# Patient Record
Sex: Female | Born: 1935 | Race: White | Hispanic: No | Smoking: Never smoker
Health system: Southern US, Community
[De-identification: ages and names within clinical notes are randomized; demographics above are authoritative.]

## PROBLEM LIST (undated history)

## (undated) DIAGNOSIS — I10 Essential (primary) hypertension: Secondary | ICD-10-CM

## (undated) DIAGNOSIS — I509 Heart failure, unspecified: Secondary | ICD-10-CM

## (undated) DIAGNOSIS — E785 Hyperlipidemia, unspecified: Secondary | ICD-10-CM

## (undated) DIAGNOSIS — I4891 Unspecified atrial fibrillation: Secondary | ICD-10-CM

## (undated) DIAGNOSIS — I272 Pulmonary hypertension, unspecified: Secondary | ICD-10-CM

## (undated) DIAGNOSIS — K219 Gastro-esophageal reflux disease without esophagitis: Secondary | ICD-10-CM

## (undated) DIAGNOSIS — I639 Cerebral infarction, unspecified: Secondary | ICD-10-CM

## (undated) DIAGNOSIS — F419 Anxiety disorder, unspecified: Secondary | ICD-10-CM

## (undated) DIAGNOSIS — R0602 Shortness of breath: Secondary | ICD-10-CM

## (undated) DIAGNOSIS — Z85038 Personal history of other malignant neoplasm of large intestine: Secondary | ICD-10-CM

## (undated) DIAGNOSIS — M1990 Unspecified osteoarthritis, unspecified site: Secondary | ICD-10-CM

## (undated) DIAGNOSIS — I2510 Atherosclerotic heart disease of native coronary artery without angina pectoris: Secondary | ICD-10-CM

## (undated) DIAGNOSIS — M069 Rheumatoid arthritis, unspecified: Secondary | ICD-10-CM

## (undated) HISTORY — DX: Unspecified osteoarthritis, unspecified site: M19.90

## (undated) HISTORY — DX: Atherosclerotic heart disease of native coronary artery without angina pectoris: I25.10

## (undated) HISTORY — DX: Personal history of other malignant neoplasm of large intestine: Z85.038

## (undated) HISTORY — DX: Anxiety disorder, unspecified: F41.9

## (undated) HISTORY — DX: Gastro-esophageal reflux disease without esophagitis: K21.9

## (undated) HISTORY — PX: APPENDECTOMY: SHX54

## (undated) HISTORY — DX: Cerebral infarction, unspecified: I63.9

## (undated) HISTORY — DX: Pulmonary hypertension, unspecified: I27.20

## (undated) HISTORY — DX: Heart failure, unspecified: I50.9

## (undated) HISTORY — DX: Shortness of breath: R06.02

## (undated) HISTORY — DX: Hyperlipidemia, unspecified: E78.5

## (undated) HISTORY — PX: TONSILLECTOMY: SHX5217

## (undated) HISTORY — DX: Unspecified atrial fibrillation: I48.91

## (undated) HISTORY — DX: Rheumatoid arthritis, unspecified: M06.9

## (undated) HISTORY — PX: CARDIAC CATHETERIZATION: SHX172

## (undated) HISTORY — DX: Essential (primary) hypertension: I10

## (undated) HISTORY — PX: CAROTID STENT: SHX1301

---

## 1987-08-06 HISTORY — PX: PARTIAL COLECTOMY: SHX5273

## 2004-09-05 ENCOUNTER — Ambulatory Visit: Payer: Self-pay | Admitting: Internal Medicine

## 2005-01-02 ENCOUNTER — Ambulatory Visit: Payer: Self-pay | Admitting: Internal Medicine

## 2005-05-10 ENCOUNTER — Ambulatory Visit: Payer: Self-pay | Admitting: Internal Medicine

## 2005-07-09 ENCOUNTER — Encounter: Payer: Self-pay | Admitting: Internal Medicine

## 2005-08-05 HISTORY — PX: CATARACT EXTRACTION: SUR2

## 2006-01-10 ENCOUNTER — Ambulatory Visit: Payer: Self-pay | Admitting: Internal Medicine

## 2006-02-20 ENCOUNTER — Ambulatory Visit: Payer: Self-pay | Admitting: Internal Medicine

## 2006-06-13 ENCOUNTER — Ambulatory Visit: Payer: Self-pay | Admitting: Ophthalmology

## 2006-06-23 ENCOUNTER — Ambulatory Visit: Payer: Self-pay | Admitting: Ophthalmology

## 2006-08-12 ENCOUNTER — Ambulatory Visit: Payer: Self-pay | Admitting: Internal Medicine

## 2007-01-21 DIAGNOSIS — K219 Gastro-esophageal reflux disease without esophagitis: Secondary | ICD-10-CM

## 2007-01-21 DIAGNOSIS — I10 Essential (primary) hypertension: Secondary | ICD-10-CM | POA: Insufficient documentation

## 2007-01-21 DIAGNOSIS — Z85038 Personal history of other malignant neoplasm of large intestine: Secondary | ICD-10-CM | POA: Insufficient documentation

## 2007-01-21 DIAGNOSIS — I2510 Atherosclerotic heart disease of native coronary artery without angina pectoris: Secondary | ICD-10-CM | POA: Insufficient documentation

## 2007-01-21 DIAGNOSIS — E785 Hyperlipidemia, unspecified: Secondary | ICD-10-CM

## 2007-01-23 DIAGNOSIS — M069 Rheumatoid arthritis, unspecified: Secondary | ICD-10-CM | POA: Insufficient documentation

## 2007-02-10 ENCOUNTER — Ambulatory Visit: Payer: Self-pay | Admitting: Internal Medicine

## 2007-02-10 LAB — CONVERTED CEMR LAB
CO2: 32 meq/L (ref 19–32)
Calcium: 9.6 mg/dL (ref 8.4–10.5)
GFR calc Af Amer: 68 mL/min
Glucose, Bld: 96 mg/dL (ref 70–99)
Potassium: 4.8 meq/L (ref 3.5–5.1)
Sodium: 144 meq/L (ref 135–145)

## 2007-02-11 ENCOUNTER — Ambulatory Visit: Payer: Self-pay | Admitting: Internal Medicine

## 2007-02-11 DIAGNOSIS — M159 Polyosteoarthritis, unspecified: Secondary | ICD-10-CM | POA: Insufficient documentation

## 2007-03-03 ENCOUNTER — Ambulatory Visit: Payer: PRIVATE HEALTH INSURANCE | Admitting: Ophthalmology

## 2007-03-03 ENCOUNTER — Other Ambulatory Visit: Payer: Self-pay

## 2007-03-11 ENCOUNTER — Ambulatory Visit: Payer: PRIVATE HEALTH INSURANCE | Admitting: Ophthalmology

## 2007-06-04 ENCOUNTER — Encounter: Payer: Self-pay | Admitting: Internal Medicine

## 2007-06-11 ENCOUNTER — Telehealth (INDEPENDENT_AMBULATORY_CARE_PROVIDER_SITE_OTHER): Payer: Self-pay | Admitting: *Deleted

## 2007-07-09 ENCOUNTER — Ambulatory Visit: Payer: Self-pay | Admitting: Internal Medicine

## 2007-07-21 ENCOUNTER — Ambulatory Visit: Payer: Self-pay | Admitting: Internal Medicine

## 2007-07-21 DIAGNOSIS — G459 Transient cerebral ischemic attack, unspecified: Secondary | ICD-10-CM | POA: Insufficient documentation

## 2007-07-22 ENCOUNTER — Encounter: Payer: Self-pay | Admitting: Internal Medicine

## 2007-07-22 ENCOUNTER — Telehealth: Payer: Self-pay | Admitting: Internal Medicine

## 2007-07-27 ENCOUNTER — Telehealth: Payer: Self-pay | Admitting: Internal Medicine

## 2007-07-27 ENCOUNTER — Encounter: Payer: Self-pay | Admitting: Internal Medicine

## 2007-08-03 ENCOUNTER — Ambulatory Visit: Payer: Self-pay | Admitting: Family Medicine

## 2007-08-03 DIAGNOSIS — I4891 Unspecified atrial fibrillation: Secondary | ICD-10-CM

## 2007-08-03 LAB — CONVERTED CEMR LAB: INR: 2.1

## 2007-08-10 ENCOUNTER — Ambulatory Visit: Payer: Self-pay | Admitting: Internal Medicine

## 2007-08-10 LAB — CONVERTED CEMR LAB: INR: 1.7

## 2007-08-12 ENCOUNTER — Encounter: Payer: Self-pay | Admitting: Internal Medicine

## 2007-08-14 ENCOUNTER — Ambulatory Visit: Payer: Self-pay | Admitting: Internal Medicine

## 2007-08-17 LAB — CONVERTED CEMR LAB
Albumin: 3.9 g/dL (ref 3.5–5.2)
Basophils Absolute: 0 10*3/uL (ref 0.0–0.1)
Cholesterol: 181 mg/dL (ref 0–200)
Eosinophils Absolute: 0.1 10*3/uL (ref 0.0–0.6)
GFR calc Af Amer: 68 mL/min
GFR calc non Af Amer: 56 mL/min
Glucose, Bld: 99 mg/dL (ref 70–99)
HCT: 38.8 % (ref 36.0–46.0)
HDL: 50.7 mg/dL (ref >39.0)
Hemoglobin: 13.6 g/dL (ref 12.0–15.0)
LDL Cholesterol: 105 mg/dL — ABNORMAL HIGH (ref 0–99)
MCHC: 35 g/dL (ref 30.0–36.0)
MCV: 92.5 fL (ref 78.0–100.0)
Monocytes Absolute: 0.4 10*3/uL (ref 0.2–0.7)
Monocytes Relative: 8 % (ref 3.0–11.0)
Neutrophils Relative %: 54.5 % (ref 43.0–77.0)
Potassium: 4.7 meq/L (ref 3.5–5.1)
RBC: 4.19 M/uL (ref 3.87–5.11)
Sodium: 137 meq/L (ref 135–145)
Total CHOL/HDL Ratio: 3.6

## 2007-08-24 ENCOUNTER — Ambulatory Visit: Payer: Self-pay | Admitting: Internal Medicine

## 2007-09-02 ENCOUNTER — Encounter: Payer: Self-pay | Admitting: Internal Medicine

## 2007-09-10 ENCOUNTER — Telehealth (INDEPENDENT_AMBULATORY_CARE_PROVIDER_SITE_OTHER): Payer: Self-pay | Admitting: *Deleted

## 2007-09-23 ENCOUNTER — Ambulatory Visit: Payer: Self-pay | Admitting: Internal Medicine

## 2007-09-23 LAB — CONVERTED CEMR LAB
INR: 3.4
Prothrombin Time: 22.4 s

## 2007-10-20 ENCOUNTER — Telehealth (INDEPENDENT_AMBULATORY_CARE_PROVIDER_SITE_OTHER): Payer: Self-pay | Admitting: *Deleted

## 2007-10-21 ENCOUNTER — Ambulatory Visit: Payer: Self-pay | Admitting: Internal Medicine

## 2007-10-21 LAB — CONVERTED CEMR LAB
INR: 3
Prothrombin Time: 21 s

## 2007-10-28 ENCOUNTER — Encounter: Payer: Self-pay | Admitting: Internal Medicine

## 2007-11-18 ENCOUNTER — Ambulatory Visit: Payer: Self-pay | Admitting: Internal Medicine

## 2007-11-25 ENCOUNTER — Ambulatory Visit: Payer: Self-pay | Admitting: Internal Medicine

## 2007-11-25 ENCOUNTER — Telehealth (INDEPENDENT_AMBULATORY_CARE_PROVIDER_SITE_OTHER): Payer: Self-pay | Admitting: *Deleted

## 2007-12-04 ENCOUNTER — Encounter: Payer: Self-pay | Admitting: Internal Medicine

## 2007-12-05 ENCOUNTER — Encounter: Payer: Self-pay | Admitting: Internal Medicine

## 2007-12-06 ENCOUNTER — Encounter: Payer: Self-pay | Admitting: Internal Medicine

## 2007-12-07 ENCOUNTER — Encounter: Payer: Self-pay | Admitting: Internal Medicine

## 2007-12-25 ENCOUNTER — Ambulatory Visit: Payer: Self-pay | Admitting: Internal Medicine

## 2007-12-25 LAB — CONVERTED CEMR LAB
INR: 2.5
Prothrombin Time: 19.2 s

## 2007-12-30 ENCOUNTER — Encounter: Payer: Self-pay | Admitting: Internal Medicine

## 2008-01-22 ENCOUNTER — Ambulatory Visit: Payer: Self-pay | Admitting: Internal Medicine

## 2008-01-22 LAB — CONVERTED CEMR LAB: INR: 2.6

## 2008-01-25 ENCOUNTER — Ambulatory Visit: Payer: Self-pay | Admitting: Internal Medicine

## 2008-01-25 DIAGNOSIS — R55 Syncope and collapse: Secondary | ICD-10-CM

## 2008-02-19 ENCOUNTER — Ambulatory Visit: Payer: Self-pay | Admitting: Internal Medicine

## 2008-03-02 ENCOUNTER — Encounter: Payer: Self-pay | Admitting: Internal Medicine

## 2008-03-08 ENCOUNTER — Ambulatory Visit: Payer: Self-pay | Admitting: Internal Medicine

## 2008-03-08 LAB — CONVERTED CEMR LAB: Prothrombin Time: 18.2 s

## 2008-03-18 ENCOUNTER — Telehealth: Payer: Self-pay | Admitting: Family Medicine

## 2008-04-05 ENCOUNTER — Ambulatory Visit: Payer: Self-pay | Admitting: Family Medicine

## 2008-04-05 ENCOUNTER — Ambulatory Visit: Payer: Self-pay | Admitting: Internal Medicine

## 2008-04-05 DIAGNOSIS — M25569 Pain in unspecified knee: Secondary | ICD-10-CM | POA: Insufficient documentation

## 2008-04-05 LAB — CONVERTED CEMR LAB: INR: 2.7

## 2008-04-06 ENCOUNTER — Encounter: Payer: Self-pay | Admitting: Internal Medicine

## 2008-04-07 ENCOUNTER — Telehealth (INDEPENDENT_AMBULATORY_CARE_PROVIDER_SITE_OTHER): Payer: Self-pay | Admitting: Internal Medicine

## 2008-04-12 ENCOUNTER — Encounter: Payer: Self-pay | Admitting: Internal Medicine

## 2008-04-28 ENCOUNTER — Ambulatory Visit: Payer: Self-pay | Admitting: Internal Medicine

## 2008-04-29 LAB — CONVERTED CEMR LAB
BUN: 16 mg/dL (ref 6–23)
Calcium: 9.3 mg/dL (ref 8.4–10.5)
Chloride: 106 meq/L (ref 96–112)
Eosinophils Relative: 2.1 % (ref 0.0–5.0)
Glucose, Bld: 87 mg/dL (ref 70–99)
HCT: 39.9 % (ref 36.0–46.0)
Hemoglobin: 13.5 g/dL (ref 12.0–15.0)
Monocytes Absolute: 0.4 10*3/uL (ref 0.1–1.0)
Monocytes Relative: 7.4 % (ref 3.0–12.0)
Platelets: 129 10*3/uL — ABNORMAL LOW (ref 150–400)
Potassium: 4.4 meq/L (ref 3.5–5.1)
RBC: 4.25 M/uL (ref 3.87–5.11)
TSH: 3.38 microintl units/mL (ref 0.35–5.50)
WBC: 4.9 10*3/uL (ref 4.5–10.5)

## 2008-05-05 ENCOUNTER — Encounter: Payer: Self-pay | Admitting: Internal Medicine

## 2008-05-06 ENCOUNTER — Telehealth: Payer: Self-pay | Admitting: Internal Medicine

## 2008-05-25 ENCOUNTER — Encounter: Payer: Self-pay | Admitting: Internal Medicine

## 2008-06-05 HISTORY — PX: TOTAL KNEE ARTHROPLASTY: SHX125

## 2008-06-10 ENCOUNTER — Encounter: Payer: Self-pay | Admitting: Internal Medicine

## 2008-06-10 ENCOUNTER — Encounter: Payer: PRIVATE HEALTH INSURANCE | Admitting: Internal Medicine

## 2008-06-21 ENCOUNTER — Encounter: Payer: Self-pay | Admitting: Internal Medicine

## 2008-06-29 ENCOUNTER — Telehealth (INDEPENDENT_AMBULATORY_CARE_PROVIDER_SITE_OTHER): Payer: Self-pay | Admitting: *Deleted

## 2008-06-29 ENCOUNTER — Encounter: Payer: Self-pay | Admitting: Family Medicine

## 2008-07-04 ENCOUNTER — Telehealth: Payer: Self-pay | Admitting: Internal Medicine

## 2008-07-12 ENCOUNTER — Telehealth: Payer: Self-pay | Admitting: Internal Medicine

## 2008-07-22 ENCOUNTER — Encounter: Payer: Self-pay | Admitting: Internal Medicine

## 2008-08-01 ENCOUNTER — Ambulatory Visit: Payer: Self-pay | Admitting: Internal Medicine

## 2008-08-03 LAB — CONVERTED CEMR LAB
Calcium: 9.3 mg/dL (ref 8.4–10.5)
Eosinophils Relative: 2.3 % (ref 0.0–5.0)
GFR calc non Af Amer: 63 mL/min
HCT: 30.3 % — ABNORMAL LOW (ref 36.0–46.0)
Hemoglobin: 9.9 g/dL — ABNORMAL LOW (ref 12.0–15.0)
Monocytes Relative: 9.6 % (ref 3.0–12.0)
Phosphorus: 3.7 mg/dL (ref 2.3–4.6)
Potassium: 4.5 meq/L (ref 3.5–5.1)
Sodium: 136 meq/L (ref 135–145)
WBC: 4.9 10*3/uL (ref 4.5–10.5)

## 2008-08-29 ENCOUNTER — Ambulatory Visit: Payer: Self-pay | Admitting: Internal Medicine

## 2008-08-29 LAB — CONVERTED CEMR LAB: Prothrombin Time: 17.3 s

## 2008-09-08 ENCOUNTER — Encounter: Payer: Self-pay | Admitting: Internal Medicine

## 2008-09-26 ENCOUNTER — Ambulatory Visit: Payer: Self-pay | Admitting: Internal Medicine

## 2008-10-10 ENCOUNTER — Ambulatory Visit: Payer: Self-pay | Admitting: Internal Medicine

## 2008-10-21 ENCOUNTER — Ambulatory Visit: Payer: Self-pay | Admitting: Internal Medicine

## 2008-10-21 DIAGNOSIS — R079 Chest pain, unspecified: Secondary | ICD-10-CM

## 2008-11-18 ENCOUNTER — Ambulatory Visit: Payer: Self-pay | Admitting: Internal Medicine

## 2008-11-18 LAB — CONVERTED CEMR LAB: Prothrombin Time: 15.6 s

## 2008-12-01 ENCOUNTER — Ambulatory Visit: Payer: Self-pay | Admitting: Internal Medicine

## 2008-12-01 LAB — CONVERTED CEMR LAB
INR: 1.8
Prothrombin Time: 16.4 s

## 2008-12-07 ENCOUNTER — Encounter: Payer: Self-pay | Admitting: Internal Medicine

## 2008-12-15 ENCOUNTER — Ambulatory Visit: Payer: Self-pay | Admitting: Internal Medicine

## 2009-01-12 ENCOUNTER — Ambulatory Visit: Payer: Self-pay | Admitting: Internal Medicine

## 2009-01-12 LAB — CONVERTED CEMR LAB: INR: 2.3

## 2009-02-09 ENCOUNTER — Ambulatory Visit: Payer: Self-pay | Admitting: Internal Medicine

## 2009-03-08 ENCOUNTER — Telehealth: Payer: Self-pay | Admitting: Internal Medicine

## 2009-03-09 ENCOUNTER — Encounter: Payer: Self-pay | Admitting: Internal Medicine

## 2009-03-10 ENCOUNTER — Encounter: Payer: Self-pay | Admitting: Internal Medicine

## 2009-04-05 ENCOUNTER — Ambulatory Visit: Payer: Self-pay | Admitting: Internal Medicine

## 2009-04-05 LAB — CONVERTED CEMR LAB
INR: 2.8
Prothrombin Time: 20.2 s

## 2009-04-18 ENCOUNTER — Ambulatory Visit: Payer: Self-pay | Admitting: Internal Medicine

## 2009-04-18 LAB — CONVERTED CEMR LAB: INR: 2.5

## 2009-05-16 ENCOUNTER — Encounter (INDEPENDENT_AMBULATORY_CARE_PROVIDER_SITE_OTHER): Payer: Self-pay | Admitting: Internal Medicine

## 2009-05-16 ENCOUNTER — Ambulatory Visit: Payer: Self-pay | Admitting: Family Medicine

## 2009-05-16 ENCOUNTER — Ambulatory Visit: Payer: Self-pay | Admitting: Internal Medicine

## 2009-05-16 LAB — CONVERTED CEMR LAB: Prothrombin Time: 21.4 s

## 2009-05-17 ENCOUNTER — Encounter: Payer: Self-pay | Admitting: Internal Medicine

## 2009-06-13 ENCOUNTER — Ambulatory Visit: Payer: Self-pay | Admitting: Internal Medicine

## 2009-06-14 ENCOUNTER — Telehealth (INDEPENDENT_AMBULATORY_CARE_PROVIDER_SITE_OTHER): Payer: Self-pay | Admitting: Internal Medicine

## 2009-06-20 ENCOUNTER — Encounter: Payer: Self-pay | Admitting: Internal Medicine

## 2009-06-22 ENCOUNTER — Encounter: Payer: Self-pay | Admitting: Internal Medicine

## 2009-07-11 ENCOUNTER — Ambulatory Visit: Payer: Self-pay | Admitting: Internal Medicine

## 2009-07-11 LAB — CONVERTED CEMR LAB: Prothrombin Time: 19 s

## 2009-07-27 ENCOUNTER — Telehealth (INDEPENDENT_AMBULATORY_CARE_PROVIDER_SITE_OTHER): Payer: Self-pay | Admitting: Internal Medicine

## 2009-08-07 ENCOUNTER — Telehealth: Payer: Self-pay | Admitting: Internal Medicine

## 2009-08-08 ENCOUNTER — Ambulatory Visit: Payer: Self-pay | Admitting: Internal Medicine

## 2009-08-08 LAB — CONVERTED CEMR LAB
INR: 2.6
Prothrombin Time: 19.5 s

## 2009-09-07 ENCOUNTER — Telehealth: Payer: Self-pay | Admitting: Internal Medicine

## 2009-09-15 ENCOUNTER — Ambulatory Visit: Payer: Self-pay | Admitting: Internal Medicine

## 2009-09-15 LAB — CONVERTED CEMR LAB: Prothrombin Time: 16.5 s

## 2009-10-12 ENCOUNTER — Ambulatory Visit: Payer: Self-pay | Admitting: Internal Medicine

## 2009-11-09 ENCOUNTER — Ambulatory Visit: Payer: Self-pay | Admitting: Internal Medicine

## 2009-11-27 ENCOUNTER — Telehealth: Payer: Self-pay | Admitting: Internal Medicine

## 2009-12-07 ENCOUNTER — Ambulatory Visit: Payer: Self-pay | Admitting: Internal Medicine

## 2009-12-07 LAB — CONVERTED CEMR LAB: INR: 2.5

## 2009-12-27 ENCOUNTER — Ambulatory Visit: Payer: Self-pay | Admitting: Internal Medicine

## 2009-12-29 LAB — CONVERTED CEMR LAB
ALT: 13 units/L (ref 0–35)
AST: 16 units/L (ref 0–37)
Alkaline Phosphatase: 67 units/L (ref 39–117)
BUN: 19 mg/dL (ref 6–23)
Basophils Relative: 0.6 % (ref 0.0–3.0)
CO2: 32 meq/L (ref 19–32)
Chloride: 101 meq/L (ref 96–112)
Eosinophils Relative: 2.5 % (ref 0.0–5.0)
GFR calc non Af Amer: 67.4 mL/min (ref >60)
HCT: 32.6 % — ABNORMAL LOW (ref 36.0–46.0)
Hemoglobin: 10.8 g/dL — ABNORMAL LOW (ref 12.0–15.0)
Lymphocytes Relative: 35.2 % (ref 12.0–46.0)
Lymphs Abs: 1.6 10*3/uL (ref 0.7–4.0)
Monocytes Relative: 10.4 % (ref 3.0–12.0)
Neutro Abs: 2.4 10*3/uL (ref 1.4–7.7)
Potassium: 4 meq/L (ref 3.5–5.1)
RBC: 4.11 M/uL (ref 3.87–5.11)
Sodium: 142 meq/L (ref 135–145)
TSH: 3.57 microintl units/mL (ref 0.35–5.50)
WBC: 4.7 10*3/uL (ref 4.5–10.5)

## 2010-01-08 ENCOUNTER — Telehealth: Payer: Self-pay | Admitting: Internal Medicine

## 2010-01-16 ENCOUNTER — Encounter: Payer: Self-pay | Admitting: Internal Medicine

## 2010-02-13 ENCOUNTER — Ambulatory Visit: Payer: Self-pay | Admitting: Internal Medicine

## 2010-02-13 LAB — CONVERTED CEMR LAB: Prothrombin Time: 30.6 s

## 2010-02-28 ENCOUNTER — Telehealth: Payer: Self-pay | Admitting: Internal Medicine

## 2010-03-13 ENCOUNTER — Ambulatory Visit: Payer: Self-pay | Admitting: Internal Medicine

## 2010-04-10 ENCOUNTER — Ambulatory Visit: Payer: Self-pay | Admitting: Internal Medicine

## 2010-04-11 LAB — CONVERTED CEMR LAB
Albumin: 4.1 g/dL (ref 3.5–5.2)
Calcium: 9.3 mg/dL (ref 8.4–10.5)
Chloride: 100 meq/L (ref 96–112)
Glucose, Bld: 84 mg/dL (ref 70–99)
Phosphorus: 3.6 mg/dL (ref 2.3–4.6)
Potassium: 4.4 meq/L (ref 3.5–5.1)
Sodium: 140 meq/L (ref 135–145)

## 2010-05-08 ENCOUNTER — Ambulatory Visit: Payer: Self-pay | Admitting: Internal Medicine

## 2010-05-08 LAB — CONVERTED CEMR LAB
INR: 2.2
Prothrombin Time: 26.4 s

## 2010-06-05 ENCOUNTER — Ambulatory Visit: Payer: Self-pay | Admitting: Internal Medicine

## 2010-06-05 HISTORY — PX: ABLATION OF DYSRHYTHMIC FOCUS: SHX254

## 2010-06-05 LAB — CONVERTED CEMR LAB: Prothrombin Time: 38 s

## 2010-06-08 ENCOUNTER — Telehealth: Payer: Self-pay | Admitting: Internal Medicine

## 2010-06-11 ENCOUNTER — Encounter: Payer: Self-pay | Admitting: Internal Medicine

## 2010-06-13 ENCOUNTER — Encounter: Payer: Self-pay | Admitting: Internal Medicine

## 2010-06-19 ENCOUNTER — Ambulatory Visit: Payer: Self-pay | Admitting: Internal Medicine

## 2010-06-19 ENCOUNTER — Telehealth (INDEPENDENT_AMBULATORY_CARE_PROVIDER_SITE_OTHER): Payer: Self-pay | Admitting: *Deleted

## 2010-06-19 LAB — CONVERTED CEMR LAB
INR: 3
Prothrombin Time: 27.3 s

## 2010-06-21 LAB — CONVERTED CEMR LAB
AST: 16 units/L (ref 0–37)
Albumin: 3.9 g/dL (ref 3.5–5.2)
BUN: 21 mg/dL (ref 6–23)
Basophils Absolute: 0 10*3/uL (ref 0.0–0.1)
CO2: 35 meq/L — ABNORMAL HIGH (ref 19–32)
Chloride: 94 meq/L — ABNORMAL LOW (ref 96–112)
Creatinine, Ser: 1.1 mg/dL (ref 0.4–1.2)
Eosinophils Absolute: 0.1 10*3/uL (ref 0.0–0.7)
Free T4: 1.03 ng/dL (ref 0.60–1.60)
GFR calc non Af Amer: 48.97 mL/min (ref >60)
HCT: 31.6 % — ABNORMAL LOW (ref 36.0–46.0)
Hemoglobin: 10.2 g/dL — ABNORMAL LOW (ref 12.0–15.0)
Lymphs Abs: 1.5 10*3/uL (ref 0.7–4.0)
MCHC: 32.3 g/dL (ref 30.0–36.0)
Monocytes Absolute: 0.6 10*3/uL (ref 0.1–1.0)
Neutro Abs: 3.2 10*3/uL (ref 1.4–7.7)
Platelets: 157 10*3/uL (ref 150.0–400.0)
RDW: 17.9 % — ABNORMAL HIGH (ref 11.5–14.6)
TSH: 5.77 microintl units/mL — ABNORMAL HIGH (ref 0.35–5.50)
Total Bilirubin: 0.7 mg/dL (ref 0.3–1.2)

## 2010-06-27 ENCOUNTER — Encounter: Payer: Self-pay | Admitting: Internal Medicine

## 2010-07-03 ENCOUNTER — Ambulatory Visit: Payer: Self-pay | Admitting: Internal Medicine

## 2010-07-03 LAB — CONVERTED CEMR LAB: INR: 2.4

## 2010-07-06 ENCOUNTER — Encounter: Payer: Self-pay | Admitting: Internal Medicine

## 2010-07-18 ENCOUNTER — Ambulatory Visit: Payer: Self-pay | Admitting: Internal Medicine

## 2010-07-19 LAB — CONVERTED CEMR LAB
INR: 2.1
Prothrombin Time: 25.3 s

## 2010-08-13 ENCOUNTER — Ambulatory Visit
Admission: RE | Admit: 2010-08-13 | Discharge: 2010-08-13 | Payer: Self-pay | Source: Home / Self Care | Attending: Internal Medicine | Admitting: Internal Medicine

## 2010-08-13 LAB — CONVERTED CEMR LAB: Prothrombin Time: 27.4 s

## 2010-09-02 LAB — CONVERTED CEMR LAB: INR: 2.3

## 2010-09-04 NOTE — Letter (Signed)
Summary: [facility]   Imported By: Lanelle Bal 06/27/2010 09:30:23  _____________________________________________________________________  External Attachment:    Type:   Image     Comment:   External Document

## 2010-09-04 NOTE — Assessment & Plan Note (Signed)
Summary: FOLLOW UP / LFW   Vital Signs:  Patient profile:   77 year old female Weight:      207 pounds O2 Sat:      97 % on Room air Temp:     97.4 degrees F oral Pulse rate:   54 / minute Pulse rhythm:   regular BP sitting:   126 / 68  (left arm) Cuff size:   large  Vitals Entered By: Mervin Hack CMA (AAMA) (September 15, 2009 11:13 AM)  O2 Flow:  Room air CC: 72month follow-up   History of Present Illness: Doing pretty well Notes general fatigue Limited exercise tolerance--legs "give out" just walking in her house Does note some SOB but not prominent  Rides cycle thing in house discussed light weights for muscle toning  No sig chest pain of late still occ uses NTG Has hollow sensation in upper chest Notes some numb feeling in chest--wonders about arthritis  does have pain chronically in neck and hands Tramadol helps these pains helps the chest sensation also  No sig edema of late--does take diuretic Sleeps with 2 pillows  Rare PND--relates to house being too warm  Allergies: 1)  ! Motrin 2)  ! Zocor 3)  ! Crestor 4)  ! Zetia  Past History:  Past medical, surgical, family and social histories (including risk factors) reviewed for relevance to current acute and chronic problems.  Past Medical History: Colon cancer, hx of Coronary artery disease------------------------------------Dr Pulikkotil GERD Hyperlipidemia Hypertension Rheumatoid arthritis Osteoarthritis Atrial fibrillation TIA--12/08 Pulmonary hypertension CHF  Past Surgical History: Reviewed history from 06/26/2009 and no changes required. Appendectomy Tonsillectomy Stent 2000 Partial colectomy colon 1989 VD x1 Cath severe MR good LV EF, mild CAD 05/2005 Cataract Ll  2007 5/09 Chest pain--cath at [facility] shows patent stent and no sig obstructive disease Right TKR 11/09 Admitted with SOB 11/10-------------severe pulm HTN found on echo  Family History: Reviewed history from  01/21/2007 and no changes required. Father: deceased 22  MI Mother: deceased 87 MI 5 brothers--2 died, 1 has lung cancer 4 sisters CAD strong in family HTN strong Son has DM Colon cancer in 2 pat aunts  Social History: Reviewed history from 01/21/2007 and no changes required. Widowed 1988 Children: 1 son Retired--textiles  Never Smoked Alcohol use-no  Review of Systems       weight is up  4+# Doesn't eat that much No heartburn problems  Physical Exam  General:  alert.  NAD Neck:  supple, no masses, no thyromegaly, no carotid bruits, and no cervical lymphadenopathy.   Lungs:  normal respiratory effort and normal breath sounds.   Heart:  normal rate, regular rhythm, no murmur, and no gallop.   Extremities:  no edema Psych:  normally interactive, good eye contact, not anxious appearing, and not depressed appearing.     Impression & Recommendations:  Problem # 1:  CHRONIC SYSTOLIC HEART FAILURE (ICD-428.22) Assessment Unchanged no change in NYHA Class 3  symptoms could have element of decondtioning discussed graded exercise neutral fluid status  Her updated medication list for this problem includes:    Adult Aspirin Ec Low Strength 81 Mg Tbec (Aspirin) .Marland Kitchen... Take 1 tablet by mouth once a day    Coumadin 5 Mg Tabs (Warfarin sodium) .Marland Kitchen... Take 1 tablet by mouth once a day    Metoprolol Tartrate 100 Mg Tabs (Metoprolol tartrate) .Marland Kitchen... Take 1 in am and 1 & 1/2 in evening    Coumadin 1 Mg Tabs (Warfarin sodium) .Marland Kitchen... Take  1 tablet three times a day or as directed    Furosemide 20 Mg Tabs (Furosemide) .Marland Kitchen... Take 1 by mouth once daily  Problem # 2:  ATRIAL FIBRILLATION, PAROXYSMAL (ICD-427.31) Assessment: Comment Only regular today on coumadin  Her updated medication list for this problem includes:    Adult Aspirin Ec Low Strength 81 Mg Tbec (Aspirin) .Marland Kitchen... Take 1 tablet by mouth once a day    Coumadin 5 Mg Tabs (Warfarin sodium) .Marland Kitchen... Take 1 tablet by mouth once a day     Metoprolol Tartrate 100 Mg Tabs (Metoprolol tartrate) .Marland Kitchen... Take 1 in am and 1 & 1/2 in evening    Coumadin 1 Mg Tabs (Warfarin sodium) .Marland Kitchen... Take 1 tablet three times a day or as directed    Norvasc 10 Mg Tabs (Amlodipine besylate) .Marland Kitchen... Take 1/2 tablet by mouth once daily  Problem # 3:  HYPERLIPIDEMIA (ICD-272.4) Assessment: Unchanged reasonable without meds  Labs Reviewed: SGPT: 17 (08/14/2007)   HDL:50.7 (08/14/2007)  LDL:105 (08/14/2007)  Chol:181 (08/14/2007)  Trig:128 (08/14/2007)  Problem # 4:  HYPERTENSION (ICD-401.9) Assessment: Unchanged good control  Her updated medication list for this problem includes:    Metoprolol Tartrate 100 Mg Tabs (Metoprolol tartrate) .Marland Kitchen... Take 1 in am and 1 & 1/2 in evening    Norvasc 10 Mg Tabs (Amlodipine besylate) .Marland Kitchen... Take 1/2 tablet by mouth once daily    Furosemide 20 Mg Tabs (Furosemide) .Marland Kitchen... Take 1 by mouth once daily  BP today: 126/68 Prior BP: 116/70 (05/16/2009)  Labs Reviewed: K+: 4.5 (08/01/2008) Creat: : 0.9 (08/01/2008)   Chol: 181 (08/14/2007)   HDL: 50.7 (08/14/2007)   LDL: 105 (08/14/2007)   TG: 128 (08/14/2007)  Problem # 5:  OSTEOARTHRITIS (ICD-715.90) Assessment: Unchanged does okay with med chest may be from some mild costochondritis  Her updated medication list for this problem includes:    Adult Aspirin Ec Low Strength 81 Mg Tbec (Aspirin) .Marland Kitchen... Take 1 tablet by mouth once a day    Tramadol Hcl 50 Mg Tabs (Tramadol hcl) .Marland Kitchen... Take 1 by mouth every 6 hours as needed pain  Complete Medication List: 1)  Adult Aspirin Ec Low Strength 81 Mg Tbec (Aspirin) .... Take 1 tablet by mouth once a day 2)  Prilosec Otc 20 Mg Tbec (Omeprazole magnesium) .... Take 1 tablet by mouth once a day 3)  Coumadin 5 Mg Tabs (Warfarin sodium) .... Take 1 tablet by mouth once a day 4)  Metoprolol Tartrate 100 Mg Tabs (Metoprolol tartrate) .... Take 1 in am and 1 & 1/2 in evening 5)  Potassium Chloride Cr 10 Meq Cpcr (Potassium  chloride) .... Take one by mouth daily 6)  Nitrolingual 0.4 Mg/spray Tl Soln (Nitroglycerin) .Marland Kitchen.. 1 spray under tongue as needed for angina 7)  Coumadin 1 Mg Tabs (Warfarin sodium) .... Take 1 tablet three times a day or as directed 8)  Tramadol Hcl 50 Mg Tabs (Tramadol hcl) .... Take 1 by mouth every 6 hours as needed pain 9)  Norvasc 10 Mg Tabs (Amlodipine besylate) .... Take 1/2 tablet by mouth once daily 10)  Fish Oil 1000 Mg Caps (Omega-3 fatty acids) .... Take 2 by mouth once daily 11)  Imdur 30 Mg Xr24h-tab (Isosorbide mononitrate) .... Take 1 by mouth once daily 12)  Furosemide 20 Mg Tabs (Furosemide) .... Take 1 by mouth once daily  Patient Instructions: 1)  Please schedule a follow-up appointment in 4 months .   Current Allergies (reviewed today): ! MOTRIN ! ZOCOR ! CRESTOR !  ZETIA

## 2010-09-04 NOTE — Letter (Signed)
Summary: [facility]  [facility],Note   Imported By: Beau Fanny 01/23/2010 10:25:49  _____________________________________________________________________  External Attachment:    Type:   Image     Comment:   External Document

## 2010-09-04 NOTE — Letter (Signed)
Summary: [facility]  [facility]   Imported By: Lanelle Bal 08/18/2009 13:46:43  _____________________________________________________________________  External Attachment:    Type:   Image     Comment:   External Document  Appended Document: [facility] CHF better since hospitalization

## 2010-09-04 NOTE — Progress Notes (Signed)
  Phone Note Call from Patient Call back at [phone]   Caller: [facility] Call For: [facility] Summary of Call: Pt. had appt. w/ Dr.Letvak today.  Pt's son called to cancel appt..  He had to call an ambulance last night to take pt. to [facility] and they admitted her. Initial call taken by: Beau Fanny,  June 08, 2010 8:16 AM  Follow-up for Phone Call        son called me at home I spoke to him and then her in the hospital at [facility] 11/5 had MI (?by enzymes) and ?cardioversion due for cath  [phone]  Spoke to her today had cath today---not sure if any sig changes Keeping her there for the next couple of days while they get her coumadin therapuetic again Cindee Salt MD  June 11, 2010 5:15 PM   message left for son she wasn't in room now---?discharged? Richard Dia Crawford MD  June 14, 2010 1:01 PM   Additional Follow-up for Phone Call Additional follow up Details #1::        spoke to son home for a couple of days due for protime on Tuesday--will set up appt with me also for hospital follow up Additional Follow-up by: Cindee Salt MD,  June 15, 2010 1:51 PM

## 2010-09-04 NOTE — Progress Notes (Signed)
----   Converted from flag ---- ---- 06/19/2010 3:47 PM, Melody Comas wrote: Called patient to reschedule appt. Patient asked that I call Ree Kida to reschedule appt. at [phone]. LMOM for Ree Kida to return my call to reschedule appt.    ---- 06/19/2010 1:35 PM, Cindee Salt MD wrote: thank you!!  ---- 06/19/2010 1:34 PM, Melody Comas wrote: Molli Knock, I will call her and reschedule that for two weeks. Thanks.   ---- 06/19/2010 1:29 PM, Cindee Salt MD wrote: Morrie Sheldon,  She should be checked again in 2 weeks---she was just started on amiodarone so we have to watch her closer  Rich ------------------------------  Appended Document:  .   Appended Document:  Appt. rescheduled.

## 2010-09-04 NOTE — Assessment & Plan Note (Signed)
Summary: 4 MONTH FOLLOW UP/[facility]   Vital Signs:  Patient profile:   77 year old female Weight:      203 pounds Temp:     98.1 degrees F oral Pulse rate:   56 / minute Pulse rhythm:   regular BP sitting:   140 / 68  (left arm) Cuff size:   regular  Vitals Entered By: Mervin Hack CMA Duncan Dull) (April 10, 2010 11:50 AM) CC: 4 month follow-up   History of Present Illness: DOing okay "doing about as good as expected"  Able to "do what I have to" Can take a bath with seat Has friend and daughter to help with house work cooks a little and son brings her food  Has had occ angina NTG occ--and it helps Has noticed some palpitations--heart will go fast at times SOme dyspnea with this usually better with some rest  Conitnues on the coumadin wondered about pradaxa--discussed my reluctance  does take her fluid pills in evening seems to work better for her then puts up with the nocturia  Ongoing arthritis pain Mostly in low back tramadol does help but tries to limit--takes 1-2 per day but may need more  has some stomach symptoms wonders about her past surgery heartburn generally controlled with prilosec   Allergies: 1)  ! Motrin 2)  ! Zocor 3)  ! Crestor 4)  ! Zetia  Past History:  Past medical, surgical, family and social histories (including risk factors) reviewed for relevance to current acute and chronic problems.  Past Medical History: Reviewed history from 09/15/2009 and no changes required. Colon cancer, hx of Coronary artery disease------------------------------------Dr Pulikkotil GERD Hyperlipidemia Hypertension Rheumatoid arthritis Osteoarthritis Atrial fibrillation TIA--12/08 Pulmonary hypertension CHF  Past Surgical History: Reviewed history from 06/26/2009 and no changes required. Appendectomy Tonsillectomy Stent 2000 Partial colectomy colon 1989 VD x1 Cath severe MR good LV EF, mild CAD 05/2005 Cataract Ll  2007 5/09 Chest pain--cath  at [facility] shows patent stent and no sig obstructive disease Right TKR 11/09 Admitted with SOB 11/10-------------severe pulm HTN found on echo  Family History: Reviewed history from 01/21/2007 and no changes required. Father: deceased 79  MI Mother: deceased 81 MI 5 brothers--2 died, 1 has lung cancer 4 sisters CAD strong in family HTN strong Son has DM Colon cancer in 2 pat aunts  Social History: Reviewed history from 01/21/2007 and no changes required. Widowed 1988 Children: 1 son Retired--textiles  Never Smoked Alcohol use-no  Review of Systems       Occ numbness in arms or legs no bruising weight is stable sleeps okay constipation at times---uses miralax and occ dulcolax  Physical Exam  General:  alert and normal appearance.   Neck:  supple, no masses, no thyromegaly, no carotid bruits, and no cervical lymphadenopathy.   Lungs:  normal respiratory effort, no intercostal retractions, no accessory muscle use, and normal breath sounds.   Heart:  normal rate, regular rhythm, no murmur, and no gallop.   Abdomen:  soft and non-tender.   Extremities:  no edema Psych:  normally interactive, good eye contact, not anxious appearing, and not depressed appearing.     Impression & Recommendations:  Problem # 1:  CHRONIC SYSTOLIC HEART FAILURE (ICD-428.22) Assessment Unchanged  compensated neutral fluid status no changes needed  Her updated medication list for this problem includes:    Coumadin 5 Mg Tabs (Warfarin sodium) .Marland Kitchen... Take 1 tablet by mouth once a day    Metoprolol Tartrate 100 Mg Tabs (Metoprolol tartrate) .Marland Kitchen... Take  1 in am and 1 & 1/2 in evening    Coumadin 1 Mg Tabs (Warfarin sodium) .Marland Kitchen... Take 1 tablet three times a day or as directed    Furosemide 20 Mg Tabs (Furosemide) .Marland Kitchen... Take 1 by mouth once daily    Adult Aspirin Ec Low Strength 81 Mg Tbec (Aspirin) .Marland Kitchen... Take 1 tablet by mouth once a day  Orders: TLB-Renal Function Panel (80069-RENAL) Venipuncture  ([id number])  Problem # 2:  ATRIAL FIBRILLATION, PAROXYSMAL (ICD-427.31) Assessment: Unchanged regular now but still gets paroxysms on coumadin  Her updated medication list for this problem includes:    Coumadin 5 Mg Tabs (Warfarin sodium) .Marland Kitchen... Take 1 tablet by mouth once a day    Metoprolol Tartrate 100 Mg Tabs (Metoprolol tartrate) .Marland Kitchen... Take 1 in am and 1 & 1/2 in evening    Coumadin 1 Mg Tabs (Warfarin sodium) .Marland Kitchen... Take 1 tablet three times a day or as directed    Norvasc 10 Mg Tabs (Amlodipine besylate) .Marland Kitchen... Take 1/2 tablet by mouth once daily    Adult Aspirin Ec Low Strength 81 Mg Tbec (Aspirin) .Marland Kitchen... Take 1 tablet by mouth once a day  Problem # 3:  OSTEOARTHRITIS (ICD-715.90) Assessment: Unchanged okay to use more tramadol if necessary  Her updated medication list for this problem includes:    Tramadol Hcl 50 Mg Tabs (Tramadol hcl) .Marland Kitchen... Take 1 by mouth every 6 hours as needed pain    Adult Aspirin Ec Low Strength 81 Mg Tbec (Aspirin) .Marland Kitchen... Take 1 tablet by mouth once a day  Problem # 4:  CORONARY ARTERY DISEASE (ICD-414.00) Assessment: Unchanged stable angina still  okay with the NTG as needed   Her updated medication list for this problem includes:    Metoprolol Tartrate 100 Mg Tabs (Metoprolol tartrate) .Marland Kitchen... Take 1 in am and 1 & 1/2 in evening    Norvasc 10 Mg Tabs (Amlodipine besylate) .Marland Kitchen... Take 1/2 tablet by mouth once daily    Imdur 30 Mg Xr24h-tab (Isosorbide mononitrate) .Marland Kitchen... Take 1 by mouth once daily    Furosemide 20 Mg Tabs (Furosemide) .Marland Kitchen... Take 1 by mouth once daily    Nitrostat 0.4 Mg Subl (Nitroglycerin) ..... Use sl as directed as needed    Adult Aspirin Ec Low Strength 81 Mg Tbec (Aspirin) .Marland Kitchen... Take 1 tablet by mouth once a day  Problem # 5:  GERD (ICD-530.81) Assessment: Unchanged okay to take an extra as needed   Her updated medication list for this problem includes:    Prilosec Otc 20 Mg Tbec (Omeprazole magnesium) .Marland Kitchen... Take 1 tablet by mouth  once a day  Complete Medication List: 1)  Coumadin 5 Mg Tabs (Warfarin sodium) .... Take 1 tablet by mouth once a day 2)  Metoprolol Tartrate 100 Mg Tabs (Metoprolol tartrate) .... Take 1 in am and 1 & 1/2 in evening 3)  Potassium Chloride Cr 10 Meq Cpcr (Potassium chloride) .... Take one by mouth daily 4)  Coumadin 1 Mg Tabs (Warfarin sodium) .... Take 1 tablet three times a day or as directed 5)  Tramadol Hcl 50 Mg Tabs (Tramadol hcl) .... Take 1 by mouth every 6 hours as needed pain 6)  Norvasc 10 Mg Tabs (Amlodipine besylate) .... Take 1/2 tablet by mouth once daily 7)  Imdur 30 Mg Xr24h-tab (Isosorbide mononitrate) .... Take 1 by mouth once daily 8)  Furosemide 20 Mg Tabs (Furosemide) .... Take 1 by mouth once daily 9)  Nitrostat 0.4 Mg Subl (Nitroglycerin) .... Use sl as  directed as needed 10)  Adult Aspirin Ec Low Strength 81 Mg Tbec (Aspirin) .... Take 1 tablet by mouth once a day 11)  Prilosec Otc 20 Mg Tbec (Omeprazole magnesium) .... Take 1 tablet by mouth once a day 12)  Fish Oil 1000 Mg Caps (Omega-3 fatty acids) .... Take 2 by mouth once daily  Patient Instructions: 1)  Please schedule a follow-up appointment in 4 months .   Current Allergies (reviewed today): ! MOTRIN ! ZOCOR ! CRESTOR ! ZETIA

## 2010-09-04 NOTE — Assessment & Plan Note (Signed)
Summary: F/U HOSPITAL/CLE   Vital Signs:  Patient profile:   78 year old female Weight:      209 pounds O2 Sat:      96 % on Room air Temp:     98.3 degrees F oral Pulse rate:   114 / minute Pulse rhythm:   regular BP sitting:   120 / 70  (left arm) Cuff size:   regular  Vitals Entered By: Mervin Hack CMA Duncan Dull) (June 19, 2010 12:25 PM)  O2 Flow:  Room air CC: hospital follow-up   History of Present Illness: Hospitalized last week Noted atrial flutter and had ablation She now notes that abnormal sensation in upper chest is gone stopped her norvasc Amiodarone started also No visible heart damage though enzymes apparently showed damage Catheterization didn't show any sig change and no interventions needed  Feels weak tires out easy happy that the chest pain is better Breathing feels better now  Arthritis pain is some better has been able to cut back on the tramadol some  Allergies: 1)  ! Motrin 2)  ! Zocor 3)  ! Crestor 4)  ! Zetia  Past History:  Past medical, surgical, family and social histories (including risk factors) reviewed for relevance to current acute and chronic problems.  Past Medical History: Reviewed history from 09/15/2009 and no changes required. Colon cancer, hx of Coronary artery disease------------------------------------Dr Pulikkotil GERD Hyperlipidemia Hypertension Rheumatoid arthritis Osteoarthritis Atrial fibrillation TIA--12/08 Pulmonary hypertension CHF  Past Surgical History: Appendectomy Tonsillectomy Stent 2000 Partial colectomy colon 1989 VD x1 Cath severe MR good LV EF, mild CAD 05/2005 Cataract Ll  2007 5/09 Chest pain--cath at [facility] shows patent stent and no sig obstructive disease Right TKR 11/09 Admitted with SOB 11/10-------------severe pulm HTN found on echo Ablation for atrial flutter 11/11  Family History: Reviewed history from 01/21/2007 and no changes required. Father: deceased 26  MI Mother:  deceased 16 MI 5 brothers--2 died, 1 has lung cancer 4 sisters CAD strong in family HTN strong Son has DM Colon cancer in 2 pat aunts  Social History: Widowed 1988 Children: 1 son Retired--textiles  Never Smoked Alcohol use-no  Son Ree Kida has health care POA. WOuld accept resuscitation but doesn't want any prolonged artifical respiration or machines  Review of Systems       appetite was gone in hospital and now is better  sleeping okay but still has sig nocturia did have some wheezing--took extra fluid pills weight is up 6# from her last visit here  Physical Exam  General:  alert.  NAD Neck:  supple, no masses, no thyromegaly, no carotid bruits, and no cervical lymphadenopathy.   Lungs:  normal respiratory effort, no intercostal retractions, no accessory muscle use, and normal breath sounds.   Heart:  regular rhythm and no gallop.   HR  ~100 Extremities:  no edema or calf tenderness Psych:  normally interactive, good eye contact, not anxious appearing, and not depressed appearing.     Impression & Recommendations:  Problem # 1:  ATRIAL FIBRILLATION, PAROXYSMAL (ICD-427.31) Assessment Comment Only  and then had atrial flutter now has resolution of long standing chest pain with the ablation will check thyroid baseline on the amiodarone  The following medications were removed from the medication list:    Norvasc 10 Mg Tabs (Amlodipine besylate) .Marland Kitchen... Take 1/2 tablet by mouth once daily Her updated medication list for this problem includes:    Coumadin 5 Mg Tabs (Warfarin sodium) .Marland Kitchen... Take 1 tablet by mouth once a  day    Metoprolol Tartrate 100 Mg Tabs (Metoprolol tartrate) .Marland Kitchen... Take 1 in am and 1 & 1/2 in evening    Coumadin 1 Mg Tabs (Warfarin sodium) .Marland Kitchen... Take 1 tablet three times a day or as directed    Adult Aspirin Ec Low Strength 81 Mg Tbec (Aspirin) .Marland Kitchen... Take 1 tablet by mouth once a day    Amiodarone Hcl 200 Mg Tabs (Amiodarone hcl) .Marland Kitchen... Take 1 by mouth once  daily  Orders: Venipuncture (78295) TLB-Renal Function Panel (80069-RENAL) TLB-CBC Platelet - w/Differential (85025-CBCD) TLB-Hepatic/Liver Function Pnl (80076-HEPATIC) TLB-TSH (Thyroid Stimulating Hormone) (84443-TSH) TLB-T4 (Thyrox), Free [phone])  Problem # 2:  CORONARY ARTERY DISEASE (ICD-414.00) Assessment: Comment Only may have had MI by enzymes but cath apparently stable  The following medications were removed from the medication list:    Norvasc 10 Mg Tabs (Amlodipine besylate) .Marland Kitchen... Take 1/2 tablet by mouth once daily Her updated medication list for this problem includes:    Metoprolol Tartrate 100 Mg Tabs (Metoprolol tartrate) .Marland Kitchen... Take 1 in am and 1 & 1/2 in evening    Imdur 30 Mg Xr24h-tab (Isosorbide mononitrate) .Marland Kitchen... Take 1 by mouth once daily    Furosemide 20 Mg Tabs (Furosemide) .Marland Kitchen... Take 1 by mouth once daily    Nitrostat 0.4 Mg Subl (Nitroglycerin) ..... Use sl as directed as needed    Adult Aspirin Ec Low Strength 81 Mg Tbec (Aspirin) .Marland Kitchen... Take 1 tablet by mouth once a day    Amiodarone Hcl 200 Mg Tabs (Amiodarone hcl) .Marland Kitchen... Take 1 by mouth once daily  Problem # 3:  HYPERTENSION (ICD-401.9) Assessment: Unchanged good control even off amlodipine may help prevent edema  The following medications were removed from the medication list:    Norvasc 10 Mg Tabs (Amlodipine besylate) .Marland Kitchen... Take 1/2 tablet by mouth once daily Her updated medication list for this problem includes:    Metoprolol Tartrate 100 Mg Tabs (Metoprolol tartrate) .Marland Kitchen... Take 1 in am and 1 & 1/2 in evening    Furosemide 20 Mg Tabs (Furosemide) .Marland Kitchen... Take 1 by mouth once daily  BP today: 120/70 Prior BP: 140/68 (04/10/2010)  Labs Reviewed: K+: 4.4 (04/10/2010) Creat: : 1.1 (04/10/2010)   Chol: 181 (08/14/2007)   HDL: 50.7 (08/14/2007)   LDL: 105 (08/14/2007)   TG: 128 (08/14/2007)  Complete Medication List: 1)  Coumadin 5 Mg Tabs (Warfarin sodium) .... Take 1 tablet by mouth once a  day 2)  Metoprolol Tartrate 100 Mg Tabs (Metoprolol tartrate) .... Take 1 in am and 1 & 1/2 in evening 3)  Potassium Chloride Cr 10 Meq Cpcr (Potassium chloride) .... Take one by mouth daily 4)  Coumadin 1 Mg Tabs (Warfarin sodium) .... Take 1 tablet three times a day or as directed 5)  Tramadol Hcl 50 Mg Tabs (Tramadol hcl) .... Take 1 by mouth every 6 hours as needed pain 6)  Imdur 30 Mg Xr24h-tab (Isosorbide mononitrate) .... Take 1 by mouth once daily 7)  Furosemide 20 Mg Tabs (Furosemide) .... Take 1 by mouth once daily 8)  Nitrostat 0.4 Mg Subl (Nitroglycerin) .... Use sl as directed as needed 9)  Adult Aspirin Ec Low Strength 81 Mg Tbec (Aspirin) .... Take 1 tablet by mouth once a day 10)  Prilosec Otc 20 Mg Tbec (Omeprazole magnesium) .... Take 1 tablet by mouth once a day 11)  Fish Oil 1000 Mg Caps (Omega-3 fatty acids) .... Take 2 by mouth once daily 12)  Amiodarone Hcl 200 Mg Tabs (Amiodarone hcl) .Marland KitchenMarland KitchenMarland Kitchen  Take 1 by mouth once daily  Patient Instructions: 1)  Please keep January 9th appt   Orders Added: 1)  Est. Patient Level IV [16109] 2)  Venipuncture [36415] 3)  TLB-Renal Function Panel [80069-RENAL] 4)  TLB-CBC Platelet - w/Differential [85025-CBCD] 5)  TLB-Hepatic/Liver Function Pnl [80076-HEPATIC] 6)  TLB-TSH (Thyroid Stimulating Hormone) [84443-TSH] 7)  TLB-T4 (Thyrox), Free [60454-UJ8J]    Current Allergies (reviewed today): ! MOTRIN ! ZOCOR ! CRESTOR ! ZETIA

## 2010-09-04 NOTE — Progress Notes (Signed)
Summary: refill request for tramadol  Phone Note Refill Request Message from:  Fax from Pharmacy  Refills Requested: Medication #1:  TRAMADOL HCL 50 MG TABS take 1 by mouth every 6 hours as needed pain   Last Refilled: 01/08/2010 Faxed request from [facility] is on your desk.  Initial call taken by: Lowella Petties CMA,  February 28, 2010 3:56 PM  Follow-up for Phone Call        Rx completed in Dr. Tiajuana Amass Follow-up by: Cindee Salt MD,  March 01, 2010 1:15 PM    Prescriptions: TRAMADOL HCL 50 MG TABS (TRAMADOL HCL) take 1 by mouth every 6 hours as needed pain  #60 x 1   Entered and Authorized by:   Cindee Salt MD   Signed by:   Cindee Salt MD on 03/01/2010   Method used:   Electronically to        [facility] [phone]* (retail)       [address] [place], [place]  [address]       Ph: [phone]       Fax: [phone]   RxID:   [id number]

## 2010-09-04 NOTE — Progress Notes (Signed)
Summary: nitro changed to tabs  Phone Note Refill Request Message from:  Fax from Pharmacy  Refills Requested: Medication #1:  NITROLINGUAL 0.4 MG/SPRAY TL SOLN 1 spray under tongue as needed for angina. Fax from [facility].  Spray is no longer available, they are asking if ok to change to SL tabs, advised ok, called in 25 with 3 refils.  Initial call taken by: Lowella Petties CMA,  January 08, 2010 5:07 PM  Follow-up for Phone Call        sounds good Please update the med list Follow-up by: Cindee Salt MD,  January 08, 2010 5:42 PM  Additional Follow-up for Phone Call Additional follow up Details #1::        Updated in med list. Additional Follow-up by: Lowella Petties CMA,  January 09, 2010 8:39 AM    New/Updated Medications: NITROSTAT 0.4 MG SUBL (NITROGLYCERIN) use SL as directed as needed  Prior Medications: ADULT ASPIRIN EC LOW STRENGTH 81 MG  TBEC (ASPIRIN) Take 1 tablet by mouth once a day PRILOSEC OTC 20 MG  TBEC (OMEPRAZOLE MAGNESIUM) Take 1 tablet by mouth once a day COUMADIN 5 MG  TABS (WARFARIN SODIUM) Take 1 tablet by mouth once a day METOPROLOL TARTRATE 100 MG  TABS (METOPROLOL TARTRATE) Take 1 in AM and 1 & 1/2 in evening POTASSIUM CHLORIDE CR 10 MEQ  CPCR (POTASSIUM CHLORIDE) Take one by mouth daily COUMADIN 1 MG  TABS (WARFARIN SODIUM) take 1 tablet three times a day or as directed TRAMADOL HCL 50 MG TABS (TRAMADOL HCL) take 1 by mouth every 6 hours as needed pain NORVASC 10 MG TABS (AMLODIPINE BESYLATE) take 1/2 tablet by mouth once daily FISH OIL 1000 MG CAPS (OMEGA-3 FATTY ACIDS) take 2 by mouth once daily IMDUR 30 MG XR24H-TAB (ISOSORBIDE MONONITRATE) take 1 by mouth once daily FUROSEMIDE 20 MG TABS (FUROSEMIDE) take 1 by mouth once daily Current Allergies: ! MOTRIN ! ZOCOR ! CRESTOR ! ZETIA

## 2010-09-04 NOTE — Progress Notes (Signed)
Summary: TRAMADOL  Phone Note Refill Request Message from:  [facility] on September 07, 2009 11:58 AM  Refills Requested: Medication #1:  TRAMADOL HCL 50 MG TABS take 1 by mouth every 6 hours as needed pain   Last Refilled: 07/27/2009 Form on your desk    Method Requested: Fax to Local Pharmacy Initial call taken by: DeShannon Smith CMA [facility] Dull),  September 07, 2009 11:58 AM  Follow-up for Phone Call        Rx completed in Dr. Tiajuana Amass Follow-up by: Cindee Salt MD,  September 07, 2009 12:43 PM    New/Updated Medications: TRAMADOL HCL 50 MG TABS (TRAMADOL HCL) take 1 by mouth every 6 hours as needed pain Prescriptions: TRAMADOL HCL 50 MG TABS (TRAMADOL HCL) take 1 by mouth every 6 hours as needed pain  #60 x 1   Entered and Authorized by:   Cindee Salt MD   Signed by:   Cindee Salt MD on 09/07/2009   Method used:   Electronically to        [facility] [phone]* (retail)       [address] [place], [place]  [address]       Ph: [phone]       Fax: [phone]   RxID:   [id number]

## 2010-09-04 NOTE — Cardiovascular Report (Signed)
Summary: Dr.George Stouffer,[facility]  Dr[facility]George Stouffer,[facility]   Imported By: Beau Fanny 06/22/2010 10:38:50  _____________________________________________________________________  External Attachment:    Type:   Image     Comment:   External Document

## 2010-09-04 NOTE — Letter (Signed)
Summary: [facility]  [facility]   Imported By: Lanelle Bal 07/07/2010 09:37:20  _____________________________________________________________________  External Attachment:    Type:   Image     Comment:   External Document  Appended Document: [facility] continue lasix 40 daily due to increased weight

## 2010-09-04 NOTE — Progress Notes (Signed)
Summary: Furosemide Dosage  Phone Note From Pharmacy   Caller: [facility] [phone] Call For: Dr. Alphonsus Sias / Geraldine Contras  Action Taken: Phone call completed Summary of Call: Patient says her Furosemide 40 mg. is now once daily rather than every other day.  Please verify.  On her DS from 06/22/09, it says 20 mg. once daily or 1/2 of a 40 mg. tablet. Initial call taken by: Delilah Shan CMA (AAMA),  August 07, 2009 4:53 PM  Follow-up for Phone Call        left message on machine for patient to return call.  and left message on machine for patient's son to return call.  DeShannon Katrinka Blazing CMA [facility] Dull)  August 08, 2009 12:23 PM   spoke with son Ree Kida and he states pt should be taking once daily, she only had 2 of the 40mg  left, so he wanted me to call in 20mg  so pt wouldn't have to worry about cutting the 40mg  in half. DeShannon Katrinka Blazing CMA (AAMA)  August 08, 2009 1:20 PM   Additional Follow-up for Phone Call Additional follow up Details #1::        noted Additional Follow-up by: Cindee Salt MD,  August 11, 2009 1:47 PM    New/Updated Medications: FUROSEMIDE 20 MG TABS (FUROSEMIDE) take 1 by mouth once daily Prescriptions: FUROSEMIDE 20 MG TABS (FUROSEMIDE) take 1 by mouth once daily  #30 x 12   Entered by:   Mervin Hack CMA (AAMA)   Authorized by:   Cindee Salt MD   Signed by:   Mervin Hack CMA (AAMA) on 08/08/2009   Method used:   Electronically to        [facility] [phone]* (retail)       [address] [place], [place]  [address]       Ph: [phone]       Fax: [phone]   RxID:   [id number]

## 2010-09-04 NOTE — Assessment & Plan Note (Signed)
Summary: 4 M F/U DLO   Vital Signs:  Patient profile:   77 year old female Height:      67 inches Weight:      205 pounds BMI:     32.22 Temp:     97.5 degrees F oral Pulse rate:   60 / minute Pulse rhythm:   regular BP sitting:   130 / 68  (left arm) Cuff size:   regular  Vitals Entered By: Mervin Hack CMA [facility] Dull) (Dec 27, 2009 11:19 AM) CC: 4 MONTH FOLLOW-UP   History of Present Illness: doing okay really does feel better than in the past  Gets fluid in throat--phlegm Is able to clear it out though No heartburn on the prilosec  Occ gets spell of SOB occ takes NTG--it does help   Occ gets mild chest pain--not as bad or as frequent as in the past Can walk 50-100 feet without stopping --no sig change  occ feels heart irregular not as bad as before  Takes tramadol regularly this controls her arthritis okay usually two times a day   Allergies: 1)  ! Motrin 2)  ! Zocor 3)  ! Crestor 4)  ! Zetia  Past History:  Past medical, surgical, family and social histories (including risk factors) reviewed for relevance to current acute and chronic problems.  Past Medical History: Reviewed history from 09/15/2009 and no changes required. Colon cancer, hx of Coronary artery disease------------------------------------Dr Pulikkotil GERD Hyperlipidemia Hypertension Rheumatoid arthritis Osteoarthritis Atrial fibrillation TIA--12/08 Pulmonary hypertension CHF  Past Surgical History: Reviewed history from 06/26/2009 and no changes required. Appendectomy Tonsillectomy Stent 2000 Partial colectomy colon 1989 VD x1 Cath severe MR good LV EF, mild CAD 05/2005 Cataract Ll  2007 5/09 Chest pain--cath at [facility] shows patent stent and no sig obstructive disease Right TKR 11/09 Admitted with SOB 11/10-------------severe pulm HTN found on echo  Family History: Reviewed history from 01/21/2007 and no changes required. Father: deceased 27  MI Mother: deceased 82 MI 5  brothers--2 died, 1 has lung cancer 4 sisters CAD strong in family HTN strong Son has DM Colon cancer in 2 pat aunts  Social History: Reviewed history from 01/21/2007 and no changes required. Widowed 1988 Children: 1 son Retired--textiles  Never Smoked Alcohol use-no  Review of Systems       appetite is okay--occ not so great weight fairly stable sleeps well  Physical Exam  General:  alert and normal appearance.   Neck:  supple, no masses, no thyromegaly, no carotid bruits, and no cervical lymphadenopathy.   Lungs:  normal respiratory effort and normal breath sounds.   Heart:  normal rate, regular rhythm, no murmur, and no gallop.   Abdomen:  soft and non-tender.   Extremities:  no edema Psych:  normally interactive, good eye contact, not anxious appearing, and not depressed appearing.     Impression & Recommendations:  Problem # 1:  CHRONIC SYSTOLIC HEART FAILURE (ICD-428.22) Assessment Unchanged  stable NYHA class 3 symptoms stable anginal pattern  Her updated medication list for this problem includes:    Adult Aspirin Ec Low Strength 81 Mg Tbec (Aspirin) .Marland Kitchen... Take 1 tablet by mouth once a day    Coumadin 5 Mg Tabs (Warfarin sodium) .Marland Kitchen... Take 1 tablet by mouth once a day    Metoprolol Tartrate 100 Mg Tabs (Metoprolol tartrate) .Marland Kitchen... Take 1 in am and 1 & 1/2 in evening    Coumadin 1 Mg Tabs (Warfarin sodium) .Marland Kitchen... Take 1 tablet three times a day or  as directed    Furosemide 20 Mg Tabs (Furosemide) .Marland Kitchen... Take 1 by mouth once daily  Orders: TLB-Renal Function Panel (80069-RENAL) TLB-CBC Platelet - w/Differential (85025-CBCD) TLB-Hepatic/Liver Function Pnl (80076-HEPATIC) TLB-TSH (Thyroid Stimulating Hormone) (84443-TSH) Venipuncture (04540)  Problem # 2:  ATRIAL FIBRILLATION, PAROXYSMAL (ICD-427.31) Assessment: Unchanged occ symptoms but not worrisome  Her updated medication list for this problem includes:    Adult Aspirin Ec Low Strength 81 Mg Tbec  (Aspirin) .Marland Kitchen... Take 1 tablet by mouth once a day    Coumadin 5 Mg Tabs (Warfarin sodium) .Marland Kitchen... Take 1 tablet by mouth once a day    Metoprolol Tartrate 100 Mg Tabs (Metoprolol tartrate) .Marland Kitchen... Take 1 in am and 1 & 1/2 in evening    Coumadin 1 Mg Tabs (Warfarin sodium) .Marland Kitchen... Take 1 tablet three times a day or as directed    Norvasc 10 Mg Tabs (Amlodipine besylate) .Marland Kitchen... Take 1/2 tablet by mouth once daily  Problem # 3:  HYPERTENSION (ICD-401.9) Assessment: Unchanged  control is adequate  Her updated medication list for this problem includes:    Metoprolol Tartrate 100 Mg Tabs (Metoprolol tartrate) .Marland Kitchen... Take 1 in am and 1 & 1/2 in evening    Norvasc 10 Mg Tabs (Amlodipine besylate) .Marland Kitchen... Take 1/2 tablet by mouth once daily    Furosemide 20 Mg Tabs (Furosemide) .Marland Kitchen... Take 1 by mouth once daily  BP today: 130/68 Prior BP: 126/68 (09/15/2009)  Labs Reviewed: K+: 4.5 (08/01/2008) Creat: : 0.9 (08/01/2008)   Chol: 181 (08/14/2007)   HDL: 50.7 (08/14/2007)   LDL: 105 (08/14/2007)   TG: 128 (08/14/2007)  Orders: Venipuncture (98119)  Problem # 4:  OSTEOARTHRITIS (ICD-715.90) Assessment: Unchanged  doing okay with regular tramadol use  Her updated medication list for this problem includes:    Adult Aspirin Ec Low Strength 81 Mg Tbec (Aspirin) .Marland Kitchen... Take 1 tablet by mouth once a day    Tramadol Hcl 50 Mg Tabs (Tramadol hcl) .Marland Kitchen... Take 1 by mouth every 6 hours as needed pain  Orders: Venipuncture (14782)  Problem # 5:  GERD (ICD-530.81) Assessment: Comment Only  may be having some reflux but acid symptoms are controlled  Her updated medication list for this problem includes:    Prilosec Otc 20 Mg Tbec (Omeprazole magnesium) .Marland Kitchen... Take 1 tablet by mouth once a day  Orders: Venipuncture (95621)  Complete Medication List: 1)  Adult Aspirin Ec Low Strength 81 Mg Tbec (Aspirin) .... Take 1 tablet by mouth once a day 2)  Prilosec Otc 20 Mg Tbec (Omeprazole magnesium) .... Take 1  tablet by mouth once a day 3)  Coumadin 5 Mg Tabs (Warfarin sodium) .... Take 1 tablet by mouth once a day 4)  Metoprolol Tartrate 100 Mg Tabs (Metoprolol tartrate) .... Take 1 in am and 1 & 1/2 in evening 5)  Potassium Chloride Cr 10 Meq Cpcr (Potassium chloride) .... Take one by mouth daily 6)  Coumadin 1 Mg Tabs (Warfarin sodium) .... Take 1 tablet three times a day or as directed 7)  Tramadol Hcl 50 Mg Tabs (Tramadol hcl) .... Take 1 by mouth every 6 hours as needed pain 8)  Norvasc 10 Mg Tabs (Amlodipine besylate) .... Take 1/2 tablet by mouth once daily 9)  Fish Oil 1000 Mg Caps (Omega-3 fatty acids) .... Take 2 by mouth once daily 10)  Imdur 30 Mg Xr24h-tab (Isosorbide mononitrate) .... Take 1 by mouth once daily 11)  Furosemide 20 Mg Tabs (Furosemide) .... Take 1 by mouth once daily  12)  Nitrolingual 0.4 Mg/spray Tl Soln (Nitroglycerin) .Marland Kitchen.. 1 spray under tongue as needed for angina  Patient Instructions: 1)  Please schedule a follow-up appointment in 4 months .   Current Allergies (reviewed today): ! MOTRIN ! ZOCOR ! CRESTOR ! ZETIA  Appended Document: 4 M F/U DLO  Laboratory Results   Blood Tests   Date/Time Recieved: Dec 27, 2009 11:50 AM  Date/Time Reported: Dec 27, 2009 11:50 AM   PT: 40.0 s   (Normal Range: 10.6-13.4)  INR: 3.3   (Normal Range: 0.88-1.12   Therap INR: 2.0-3.5)      ANTICOAGULATION RECORD PREVIOUS REGIMEN & LAB RESULTS Anticoagulation Diagnosis:  Atrial fibrillation on  09/15/2009 Previous INR Goal Range:  2.0-3.0 on  09/15/2009 Previous INR:  2.5 on  12/07/2009 Previous Coumadin Dose(mg):  7.5 mg daily, 5 mg Mon/Fri on  09/15/2009 Previous Regimen:  no change on  09/15/2009 Previous Coagulation Comments:  . on  05/16/2009  NEW REGIMEN & LAB RESULTS Current INR: 3.3 Regimen: no change  (no change)  Provider: Kaydan Wilhoite      Repeat testing in: 4 weeks MEDICATIONS ADULT ASPIRIN EC LOW STRENGTH 81 MG  TBEC (ASPIRIN) Take 1 tablet by mouth  once a day PRILOSEC OTC 20 MG  TBEC (OMEPRAZOLE MAGNESIUM) Take 1 tablet by mouth once a day COUMADIN 5 MG  TABS (WARFARIN SODIUM) Take 1 tablet by mouth once a day METOPROLOL TARTRATE 100 MG  TABS (METOPROLOL TARTRATE) Take 1 in AM and 1 & 1/2 in evening POTASSIUM CHLORIDE CR 10 MEQ  CPCR (POTASSIUM CHLORIDE) Take one by mouth daily COUMADIN 1 MG  TABS (WARFARIN SODIUM) take 1 tablet three times a day or as directed TRAMADOL HCL 50 MG TABS (TRAMADOL HCL) take 1 by mouth every 6 hours as needed pain NORVASC 10 MG TABS (AMLODIPINE BESYLATE) take 1/2 tablet by mouth once daily FISH OIL 1000 MG CAPS (OMEGA-3 FATTY ACIDS) take 2 by mouth once daily IMDUR 30 MG XR24H-TAB (ISOSORBIDE MONONITRATE) take 1 by mouth once daily FUROSEMIDE 20 MG TABS (FUROSEMIDE) take 1 by mouth once daily NITROLINGUAL 0.4 MG/SPRAY TL SOLN (NITROGLYCERIN) 1 spray under tongue as needed for angina   Anticoagulation Visit Questionnaire      Coumadin dose missed/changed:  No      Abnormal Bleeding Symptoms:  No Any diet changes including alcohol intake, vegetables or greens since the last visit:  No Any illnesses or hospitalizations since the last visit:  No Any signs of clotting since the last visit (including chest discomfort, dizziness, shortness of breath, arm tingling, slurred speech, swelling or redness in leg):  No

## 2010-09-04 NOTE — Progress Notes (Signed)
Summary: Rx Tramadol  Phone Note Refill Request Call back at [phone] Message from:  [facility] on November 27, 2009 3:55 PM  Refills Requested: Medication #1:  TRAMADOL HCL 50 MG TABS take 1 by mouth every 6 hours as needed pain   Last Refilled: 10/23/2009 Receieved fax, form in your IN box please advise.   Method Requested: Fax to Local Pharmacy Initial call taken by: Linde Gillis CMA Duncan Dull),  November 27, 2009 3:56 PM  Follow-up for Phone Call        okay #60 x 1 Follow-up by: Cindee Salt MD,  November 27, 2009 5:38 PM  Additional Follow-up for Phone Call Additional follow up Details #1::        Rx faxed to pharmacy Additional Follow-up by: DeShannon Smith CMA Duncan Dull),  November 28, 2009 8:34 AM    Prescriptions: TRAMADOL HCL 50 MG TABS (TRAMADOL HCL) take 1 by mouth every 6 hours as needed pain  #60 x 1   Entered by:   Mervin Hack CMA (AAMA)   Authorized by:   Cindee Salt MD   Signed by:   Mervin Hack CMA (AAMA) on 11/28/2009   Method used:   Handwritten   RxID:   [id number]

## 2010-09-06 NOTE — Assessment & Plan Note (Signed)
Summary: F/UP / LFW  R/S FROM 08/07/10   Vital Signs:  Patient profile:   77 year old female Weight:      205 pounds Temp:     98.1 degrees F oral Pulse rate:   62 / minute Pulse rhythm:   regular Resp:     24 per minute BP sitting:   140 / 70  (left arm) Cuff size:   regular  Vitals Entered By: Mervin Hack CMA Duncan Dull) (August 13, 2010 10:15 AM) CC: 4 month follow-up   History of Present Illness: Doing okay She still notes poor appetite--then occ awakens in night and is hungry Does use nutrition drinks if not eating well  Energy levels improving but still not great Son notes concern about her ongoing weakness  Has had ongoing cough Phlegm--dark at times-- other times white. THis is improving now Did have sense of being "stopped up" in along left sternum--better now Breathing is some better now Has used some robitussin feels some congestion in throat  Has only had one bad spell with palpitations  ~12/23, noted  pain again. Refused having rescue called No recurrence since then though Keeps head up at night but still occ PND  Allergies: 1)  ! Motrin 2)  ! Zocor 3)  ! Crestor 4)  ! Zetia  Past History:  Past medical, surgical, family and social histories (including risk factors) reviewed for relevance to current acute and chronic problems.  Past Medical History: Reviewed history from 09/15/2009 and no changes required. Colon cancer, hx of Coronary artery disease------------------------------------Dr Pulikkotil GERD Hyperlipidemia Hypertension Rheumatoid arthritis Osteoarthritis Atrial fibrillation TIA--12/08 Pulmonary hypertension CHF  Past Surgical History: Reviewed history from 06/19/2010 and no changes required. Appendectomy Tonsillectomy Stent 2000 Partial colectomy colon 1989 VD x1 Cath severe MR good LV EF, mild CAD 05/2005 Cataract Ll  2007 5/09 Chest pain--cath at [facility] shows patent stent and no sig obstructive disease Right TKR  11/09 Admitted with SOB 11/10-------------severe pulm HTN found on echo Ablation for atrial flutter 11/11  Family History: Reviewed history from 01/21/2007 and no changes required. Father: deceased 93  MI Mother: deceased 20 MI 5 brothers--2 died, 1 has lung cancer 4 sisters CAD strong in family HTN strong Son has DM Colon cancer in 2 pat aunts  Social History: Reviewed history from 06/19/2010 and no changes required. Widowed 1988 Children: 1 son Retired--textiles  Never Smoked Alcohol use-no  Son Ree Kida has health care POA. WOuld accept resuscitation but doesn't want any prolonged artifical respiration or machines  Review of Systems       Has been using extra lasix weight down 4# since last visit   Physical Exam  General:  alert.  NAD Head:  no sinus tenderness Mouth:  no erythema and no exudates.   Neck:  supple, no masses, no thyromegaly, and no cervical lymphadenopathy.   Lungs:  normal respiratory effort, no intercostal retractions, no accessory muscle use, normal breath sounds, no crackles, and no wheezes.   Heart:  normal rate, regular rhythm, and no gallop.   Extremities:  at most trace edema Psych:  normally interactive, good eye contact, not anxious appearing, and not depressed appearing.     Impression & Recommendations:  Problem # 1:  BRONCHITIS- ACUTE (ICD-466.0) Assessment New ongoing productive cough seems to be improving doesn't seem to need antibiotic will start antibiotic if worsens now  Problem # 2:  CHRONIC SYSTOLIC HEART FAILURE (ICD-428.22) Assessment: Unchanged stable functional status weight down slightly--she does adjust lasix  as needed   Her updated medication list for this problem includes:    Coumadin 5 Mg Tabs (Warfarin sodium) .Marland Kitchen... Take 1 tablet by mouth once a day    Metoprolol Tartrate 100 Mg Tabs (Metoprolol tartrate) .Marland Kitchen... Take 1/2 in the am take 1/2 at lunch and take 1 by mouth at bedtime    Coumadin 1 Mg Tabs (Warfarin  sodium) .Marland Kitchen... Take as directed    Furosemide 20 Mg Tabs (Furosemide) .Marland Kitchen... Take 1 by mouth once daily    Adult Aspirin Ec Low Strength 81 Mg Tbec (Aspirin) .Marland Kitchen... Take 1 tablet by mouth once a day  Problem # 3:  ATRIAL FIBRILLATION, PAROXYSMAL (ICD-427.31) Assessment: Improved still regular since atrial flutter ablation  Her updated medication list for this problem includes:    Amiodarone Hcl 200 Mg Tabs (Amiodarone hcl) .Marland Kitchen... Take 1 by mouth once daily    Coumadin 5 Mg Tabs (Warfarin sodium) .Marland Kitchen... Take 1 tablet by mouth once a day    Metoprolol Tartrate 100 Mg Tabs (Metoprolol tartrate) .Marland Kitchen... Take 1/2 in the am take 1/2 at lunch and take 1 by mouth at bedtime    Coumadin 1 Mg Tabs (Warfarin sodium) .Marland Kitchen... Take as directed    Adult Aspirin Ec Low Strength 81 Mg Tbec (Aspirin) .Marland Kitchen... Take 1 tablet by mouth once a day  Problem # 4:  CORONARY ARTERY DISEASE (ICD-414.00) Assessment: Unchanged bad chest pain  ~2 weeks ago but generally not as often as in past  Her updated medication list for this problem includes:    Amiodarone Hcl 200 Mg Tabs (Amiodarone hcl) .Marland Kitchen... Take 1 by mouth once daily    Metoprolol Tartrate 100 Mg Tabs (Metoprolol tartrate) .Marland Kitchen... Take 1/2 in the am take 1/2 at lunch and take 1 by mouth at bedtime    Imdur 30 Mg Xr24h-tab (Isosorbide mononitrate) .Marland Kitchen... Take 1 by mouth once daily    Furosemide 20 Mg Tabs (Furosemide) .Marland Kitchen... Take 1 by mouth once daily    Nitrostat 0.4 Mg Subl (Nitroglycerin) ..... Use sl as directed as needed    Adult Aspirin Ec Low Strength 81 Mg Tbec (Aspirin) .Marland Kitchen... Take 1 tablet by mouth once a day  Problem # 5:  HYPERTENSION (ICD-401.9) Assessment: Unchanged reasonable control  Her updated medication list for this problem includes:    Metoprolol Tartrate 100 Mg Tabs (Metoprolol tartrate) .Marland Kitchen... Take 1/2 in the am take 1/2 at lunch and take 1 by mouth at bedtime    Furosemide 20 Mg Tabs (Furosemide) .Marland Kitchen... Take 1 by mouth once daily  BP today:  140/70 Prior BP: 120/70 (06/19/2010)  Labs Reviewed: K+: 3.8 (06/19/2010) Creat: : 1.1 (06/19/2010)   Chol: 181 (08/14/2007)   HDL: 50.7 (08/14/2007)   LDL: 105 (08/14/2007)   TG: 128 (08/14/2007)  Complete Medication List: 1)  Amiodarone Hcl 200 Mg Tabs (Amiodarone hcl) .... Take 1 by mouth once daily 2)  Coumadin 5 Mg Tabs (Warfarin sodium) .... Take 1 tablet by mouth once a day 3)  Metoprolol Tartrate 100 Mg Tabs (Metoprolol tartrate) .... Take 1/2 in the am take 1/2 at lunch and take 1 by mouth at bedtime 4)  Potassium Chloride Cr 10 Meq Cpcr (Potassium chloride) .... Take one by mouth daily 5)  Coumadin 1 Mg Tabs (Warfarin sodium) .... Take as directed 6)  Tramadol Hcl 50 Mg Tabs (Tramadol hcl) .... Take 1 by mouth every 6 hours as needed pain 7)  Imdur 30 Mg Xr24h-tab (Isosorbide mononitrate) .... Take 1 by mouth  once daily 8)  Furosemide 20 Mg Tabs (Furosemide) .... Take 1 by mouth once daily 9)  Nitrostat 0.4 Mg Subl (Nitroglycerin) .... Use sl as directed as needed 10)  Adult Aspirin Ec Low Strength 81 Mg Tbec (Aspirin) .... Take 1 tablet by mouth once a day 11)  Prilosec Otc 20 Mg Tbec (Omeprazole magnesium) .... Take 1 tablet by mouth once a day 12)  Fish Oil 1000 Mg Caps (Omega-3 fatty acids) .... Take 2 by mouth once daily  Patient Instructions: 1)  Please call if you start bringing up dark phlegm again 2)  Please schedule a follow-up appointment in 1 month.    Orders Added: 1)  Est. Patient Level IV [04540]    Current Allergies (reviewed today): ! MOTRIN ! ZOCOR ! CRESTOR ! ZETIA

## 2010-09-06 NOTE — Letter (Signed)
Summary: [facility]  [facility]   Imported By: Lanelle Bal 07/23/2010 11:34:57  _____________________________________________________________________  External Attachment:    Type:   Image     Comment:   External Document

## 2010-09-07 ENCOUNTER — Ambulatory Visit: Payer: Self-pay

## 2010-09-10 ENCOUNTER — Ambulatory Visit: Payer: Self-pay | Admitting: Internal Medicine

## 2010-09-10 ENCOUNTER — Ambulatory Visit (INDEPENDENT_AMBULATORY_CARE_PROVIDER_SITE_OTHER): Payer: Medicare Other

## 2010-09-10 ENCOUNTER — Encounter: Payer: Self-pay | Admitting: Internal Medicine

## 2010-09-10 ENCOUNTER — Ambulatory Visit (INDEPENDENT_AMBULATORY_CARE_PROVIDER_SITE_OTHER): Payer: Medicare Other | Admitting: Internal Medicine

## 2010-09-10 DIAGNOSIS — I10 Essential (primary) hypertension: Secondary | ICD-10-CM

## 2010-09-10 DIAGNOSIS — Z7901 Long term (current) use of anticoagulants: Secondary | ICD-10-CM

## 2010-09-10 DIAGNOSIS — I4891 Unspecified atrial fibrillation: Secondary | ICD-10-CM

## 2010-09-10 DIAGNOSIS — Z5181 Encounter for therapeutic drug level monitoring: Secondary | ICD-10-CM

## 2010-09-10 DIAGNOSIS — K219 Gastro-esophageal reflux disease without esophagitis: Secondary | ICD-10-CM

## 2010-09-10 DIAGNOSIS — I5022 Chronic systolic (congestive) heart failure: Secondary | ICD-10-CM

## 2010-09-20 NOTE — Assessment & Plan Note (Signed)
Summary: 1 MONTH F/U/ALC  Nurse Visit   Vital Signs:  Patient profile:   77 year old female Weight:      207 pounds Temp:     98.2 degrees F oral Pulse rate:   61 / minute Pulse rhythm:   regular BP sitting:   118 / 80  (left arm) Cuff size:   regular  Vitals Entered By: Mervin Hack CMA Duncan Dull) (September 10, 2010 2:28 PM)  Review of Systems       Brief right earache which is better now weight fairly stable   Past History:  Past medical, surgical, family and social histories (including risk factors) reviewed for relevance to current acute and chronic problems.  Past Medical History: Reviewed history from 09/15/2009 and no changes required. Colon cancer, hx of Coronary artery disease------------------------------------Dr Pulikkotil GERD Hyperlipidemia Hypertension Rheumatoid arthritis Osteoarthritis Atrial fibrillation TIA--12/08 Pulmonary hypertension CHF  Past Surgical History: Reviewed history from 06/19/2010 and no changes required. Appendectomy Tonsillectomy Stent 2000 Partial colectomy colon 1989 VD x1 Cath severe MR good LV EF, mild CAD 05/2005 Cataract Ll  2007 5/09 Chest pain--cath at [facility] shows patent stent and no sig obstructive disease Right TKR 11/09 Admitted with SOB 11/10-------------severe pulm HTN found on echo Ablation for atrial flutter 11/11   History of Present Illness: Here with granddaughter  DOing okay in general May have had a stomach bug---occ nausea No abd pain Vomiting once last night No diarrhea  Cough is gone Breathing is okay but occ gets "knife like" pain across left chest Often pleuritic--with a deep breath No other chest pain---only took nitro once since last visit here  Slight scratchy sensation in throat May be related to acid reflux  No recent palpitations--occ fast if "excited" overall much better  ?related to amiodarone over the past 2 months   Physical Exam  General:  alert.  NAD Neck:  supple, no  masses, and no cervical lymphadenopathy.   Lungs:  normal respiratory effort, no intercostal retractions, no accessory muscle use, and normal breath sounds.   Heart:  normal rate, regular rhythm, no murmur, and no gallop.   Extremities:  trace edema Psych:  normally interactive, good eye contact, not anxious appearing, and not depressed appearing.     Impression & Recommendations:  Problem # 1:  CHRONIC SYSTOLIC HEART FAILURE (ICD-428.22) Assessment Improved notes improved general status has been doing some light housework, takes a bath herself Now seems to be at NYHA class 2 symtpoms, instead of 3  Her updated medication list for this problem includes:    Coumadin 5 Mg Tabs (Warfarin sodium) .Marland Kitchen... Take 1 tablet by mouth once a day    Metoprolol Tartrate 100 Mg Tabs (Metoprolol tartrate) .Marland Kitchen... Take 1/2 in the am take 1/2 at lunch and take 1 by mouth at bedtime    Coumadin 1 Mg Tabs (Warfarin sodium) .Marland Kitchen... Take as directed    Furosemide 20 Mg Tabs (Furosemide) .Marland Kitchen... Take 1 by mouth once daily    Adult Aspirin Ec Low Strength 81 Mg Tbec (Aspirin) .Marland Kitchen... Take 1 tablet by mouth once a day  Problem # 2:  ATRIAL FIBRILLATION, PAROXYSMAL (ICD-427.31) Assessment: Improved seems regular on the amiodarone now continues on the coumadin--getting recheck today  Her updated medication list for this problem includes:    Amiodarone Hcl 200 Mg Tabs (Amiodarone hcl) .Marland Kitchen... Take 1 by mouth once daily    Coumadin 5 Mg Tabs (Warfarin sodium) .Marland Kitchen... Take 1 tablet by mouth once a day  Metoprolol Tartrate 100 Mg Tabs (Metoprolol tartrate) .Marland Kitchen... Take 1/2 in the am take 1/2 at lunch and take 1 by mouth at bedtime    Coumadin 1 Mg Tabs (Warfarin sodium) .Marland Kitchen... Take as directed    Adult Aspirin Ec Low Strength 81 Mg Tbec (Aspirin) .Marland Kitchen... Take 1 tablet by mouth once a day  Problem # 3:  GERD (ICD-530.81) Assessment: Deteriorated worsened symptoms may be some of her chest symptoms will in crease to two times a  day  The following medications were removed from the medication list:    Prilosec Otc 20 Mg Tbec (Omeprazole magnesium) .Marland Kitchen... Take 1 tablet by mouth once a day Her updated medication list for this problem includes:    Omeprazole 20 Mg Cpdr (Omeprazole) .Marland Kitchen... 1 tab by mouth two times a day for acid reflux  Problem # 4:  HYPERTENSION (ICD-401.9) Assessment: Unchanged control is good on heart regimen  Her updated medication list for this problem includes:    Metoprolol Tartrate 100 Mg Tabs (Metoprolol tartrate) .Marland Kitchen... Take 1/2 in the am take 1/2 at lunch and take 1 by mouth at bedtime    Furosemide 20 Mg Tabs (Furosemide) .Marland Kitchen... Take 1 by mouth once daily  BP today: 118/80 Prior BP: 140/70 (08/13/2010)  Labs Reviewed: K+: 3.8 (06/19/2010) Creat: : 1.1 (06/19/2010)   Chol: 181 (08/14/2007)   HDL: 50.7 (08/14/2007)   LDL: 105 (08/14/2007)   TG: 128 (08/14/2007)  Complete Medication List: 1)  Amiodarone Hcl 200 Mg Tabs (Amiodarone hcl) .... Take 1 by mouth once daily 2)  Coumadin 5 Mg Tabs (Warfarin sodium) .... Take 1 tablet by mouth once a day 3)  Metoprolol Tartrate 100 Mg Tabs (Metoprolol tartrate) .... Take 1/2 in the am take 1/2 at lunch and take 1 by mouth at bedtime 4)  Potassium Chloride Cr 10 Meq Cpcr (Potassium chloride) .... Take one by mouth daily 5)  Coumadin 1 Mg Tabs (Warfarin sodium) .... Take as directed 6)  Tramadol Hcl 50 Mg Tabs (Tramadol hcl) .... Take 1 by mouth every 6 hours as needed pain 7)  Imdur 30 Mg Xr24h-tab (Isosorbide mononitrate) .... Take 1 by mouth once daily 8)  Furosemide 20 Mg Tabs (Furosemide) .... Take 1 by mouth once daily 9)  Nitrostat 0.4 Mg Subl (Nitroglycerin) .... Use sl as directed as needed 10)  Adult Aspirin Ec Low Strength 81 Mg Tbec (Aspirin) .... Take 1 tablet by mouth once a day 11)  Fish Oil 1000 Mg Caps (Omega-3 fatty acids) .... Take 2 by mouth once daily 12)  Omeprazole 20 Mg Cpdr (Omeprazole) .Marland Kitchen.. 1 tab by mouth two times a day for  acid reflux   Patient Instructions: 1)  Please schedule a follow-up appointment in 3 months .   CC: 1 month follow-up   Allergies: 1)  ! Motrin 2)  ! Zocor 3)  ! Crestor 4)  ! Zetia  Orders Added: 1)  Est. Patient Level IV [13086] Prescriptions: OMEPRAZOLE 20 MG CPDR (OMEPRAZOLE) 1 tab by mouth two times a day for acid reflux  #60 x 12   Entered and Authorized by:   Cindee Salt MD   Signed by:   Cindee Salt MD on 09/10/2010   Method used:   Electronically to        [facility] [phone]* (retail)       [address] [place], [place]  [address]       Ph:  [phone]       Fax: [phone]   RxID:   [id number]   Current Allergies (reviewed today): ! MOTRIN ! ZOCOR ! CRESTOR ! ZETIA

## 2010-09-25 ENCOUNTER — Ambulatory Visit (INDEPENDENT_AMBULATORY_CARE_PROVIDER_SITE_OTHER): Payer: Medicare Other

## 2010-09-25 ENCOUNTER — Encounter: Payer: Self-pay | Admitting: Internal Medicine

## 2010-09-25 DIAGNOSIS — Z7901 Long term (current) use of anticoagulants: Secondary | ICD-10-CM

## 2010-09-25 DIAGNOSIS — Z5181 Encounter for therapeutic drug level monitoring: Secondary | ICD-10-CM

## 2010-09-25 DIAGNOSIS — I4891 Unspecified atrial fibrillation: Secondary | ICD-10-CM

## 2010-10-02 NOTE — Medication Information (Signed)
Summary: protime/tw   Indication 1: Atrial fibrillation PT 27.3 INR RANGE 2.0-3.0           Allergies: 1)  ! Motrin 2)  ! Zocor 3)  ! Crestor 4)  ! Zetia  Anticoagulation Management History:      Positive risk factors for bleeding include an age of 74 years or older and history of CVA/TIA.  The bleeding index is 'intermediate risk'.  Positive CHADS2 values include History of CHF, History of HTN, Age > 74 years old, and Prior Stroke/CVA/TIA.  Her last INR was 3.6 and today's INR is 2.3.  Prothrombin time is 27.3.    Anticoagulation Management Assessment/Plan:      The patient's current anticoagulation dose is Coumadin 5 mg  tabs: Take 1 tablet by mouth once a day, Coumadin 1 mg  tabs: take as directed.  The next INR is due 4 weeks.        Laboratory Results   Blood Tests   Date/Time Recieved: September 25, 2010 2:05 PM  Date/Time Reported: September 25, 2010 2:05 PM   PT: 27.3 s   (Normal Range: 10.6-13.4)  INR: 2.3   (Normal Range: 0.88-1.12   Therap INR: 2.0-3.5)      ANTICOAGULATION RECORD PREVIOUS REGIMEN & LAB RESULTS Anticoagulation Diagnosis:  Atrial fibrillation on  09/15/2009 Previous INR Goal Range:  2.0-3.0 on  09/15/2009 Previous INR:  3.6 on  09/10/2010 Previous Coumadin Dose(mg):  5mg  daily on  08/13/2010 Previous Regimen:  2.5mg  today only,5mg  qd on  09/10/2010 Previous Coagulation Comments:  . on  05/16/2009  NEW REGIMEN & LAB RESULTS Current INR: 2.3 Regimen: 5.0 mg qd, 2.5mg  every Mondays  Provider: letvak      Repeat testing in: 4 weeks MEDICATIONS AMIODARONE HCL 200 MG TABS (AMIODARONE HCL) take 1 by mouth once daily COUMADIN 5 MG  TABS (WARFARIN SODIUM) Take 1 tablet by mouth once a day METOPROLOL TARTRATE 100 MG  TABS (METOPROLOL TARTRATE) take 1/2 in the AM take 1/2 at lunch and take 1 by mouth at bedtime POTASSIUM CHLORIDE CR 10 MEQ  CPCR (POTASSIUM CHLORIDE) Take one by mouth daily COUMADIN 1 MG  TABS (WARFARIN SODIUM) take as  directed TRAMADOL HCL 50 MG TABS (TRAMADOL HCL) take 1 by mouth every 6 hours as needed pain IMDUR 30 MG XR24H-TAB (ISOSORBIDE MONONITRATE) take 1 by mouth once daily FUROSEMIDE 20 MG TABS (FUROSEMIDE) take 1 by mouth once daily NITROSTAT 0.4 MG SUBL (NITROGLYCERIN) use SL as directed as needed ADULT ASPIRIN EC LOW STRENGTH 81 MG  TBEC (ASPIRIN) Take 1 tablet by mouth once a day FISH OIL 1000 MG CAPS (OMEGA-3 FATTY ACIDS) take 2 by mouth once daily OMEPRAZOLE 20 MG CPDR (OMEPRAZOLE) 1 tab by mouth two times a day for acid reflux  Dose has been reviewed with patient or caretaker during this visit.  Reviewed by: Allison Quarry  Anticoagulation Visit Questionnaire      Coumadin dose missed/changed:  No      Abnormal Bleeding Symptoms:  No Any diet changes including alcohol intake, vegetables or greens since the last visit:  No Any illnesses or hospitalizations since the last visit:  No Any signs of clotting since the last visit (including chest discomfort, dizziness, shortness of breath, arm tingling, slurred speech, swelling or redness in leg):  No

## 2010-10-05 ENCOUNTER — Telehealth: Payer: Self-pay | Admitting: Internal Medicine

## 2010-10-16 NOTE — Progress Notes (Signed)
Summary: refill requests  Phone Note Refill Request Message from:  Fax from Pharmacy  Refills Requested: Medication #1:  AMIODARONE HCL 200 MG TABS take 1 by mouth once daily   Last Refilled: 09/10/2010  Medication #2:  METOPROLOL TARTRATE 100 MG  TABS take 1/2 in the AM take 1/2 at lunch and take 1 by mouth at bedtime  Medication #3:  Lisinopril 5 mg Faxed requests from [facility] are on your desk.  Lisinopril not on med list, metoprolol succinate on med list- not tartrate.  Initial call taken by: Lowella Petties CMA, AAMA,  October 05, 2010 12:11 PM  Follow-up for Phone Call        Rx completed in Dr. Tiajuana Amass  Please let pharmacy know the metoprolol is now tartrate on our list and no lisinopril Follow-up by: Cindee Salt MD,  October 05, 2010 1:32 PM  Additional Follow-up for Phone Call Additional follow up Details #1::        Pt's grand daughter states pt was given lisinopril by her cardiologist in [place].  She is taking 5 mg's once a  day.  Her metoprolol script is for metoprol succinate 50 mg's, one a day.  That is how they need to be called to [facility].  I advised grand daughter, and [facility], to call the cardiologist for these refills, since that is who is following the pt and has been prescribing, and then call for an appt for a follow up visit here, and bring all current meds. Additional Follow-up by: Lowella Petties CMA, AAMA,  October 05, 2010 3:38 PM    Additional Follow-up for Phone Call Additional follow up Details #2::    okay Follow-up by: Cindee Salt MD,  October 08, 2010 9:31 AM  Prescriptions: METOPROLOL TARTRATE 100 MG  TABS (METOPROLOL TARTRATE) take 1/2 in the AM take 1/2 at lunch and take 1 by mouth at bedtime  #30 x 11   Entered and Authorized by:   Cindee Salt MD   Signed by:   Cindee Salt MD on 10/05/2010   Method used:   Electronically to        [facility] [phone]* (retail)       [address] [place], [place]   [address]       Ph: [phone]       Fax: [phone]   RxID:   [id number] AMIODARONE HCL 200 MG TABS (AMIODARONE HCL) take 1 by mouth once daily  #30 x 11   Entered and Authorized by:   Cindee Salt MD   Signed by:   Cindee Salt MD on 10/05/2010   Method used:   Electronically to        [facility] [phone]* (retail)       [address] [place], [place]  [address]       Ph: [phone]       Fax: [phone]   RxID:   [id number]

## 2010-10-23 ENCOUNTER — Other Ambulatory Visit: Payer: Self-pay | Admitting: *Deleted

## 2010-10-23 MED ORDER — TRAMADOL HCL 50 MG PO TABS: 50.0000 mg | ORAL_TABLET | Freq: Four times a day (QID) | ORAL | Status: AC | PRN

## 2010-10-23 NOTE — Telephone Encounter (Signed)
Okay #60 x

## 2010-10-25 ENCOUNTER — Ambulatory Visit (INDEPENDENT_AMBULATORY_CARE_PROVIDER_SITE_OTHER): Payer: Medicare Other | Admitting: Internal Medicine

## 2010-10-25 ENCOUNTER — Telehealth: Payer: Self-pay | Admitting: Radiology

## 2010-10-25 DIAGNOSIS — I4891 Unspecified atrial fibrillation: Secondary | ICD-10-CM

## 2010-10-25 DIAGNOSIS — Z5181 Encounter for therapeutic drug level monitoring: Secondary | ICD-10-CM

## 2010-10-25 DIAGNOSIS — Z7901 Long term (current) use of anticoagulants: Secondary | ICD-10-CM

## 2010-10-25 LAB — POCT INR: INR: 1.8

## 2010-10-25 NOTE — Patient Instructions (Signed)
Take 5mg daily.

## 2010-10-25 NOTE — Telephone Encounter (Signed)
Order for INR

## 2010-11-21 ENCOUNTER — Ambulatory Visit (INDEPENDENT_AMBULATORY_CARE_PROVIDER_SITE_OTHER): Payer: Medicare Other | Admitting: Internal Medicine

## 2010-11-21 DIAGNOSIS — Z7901 Long term (current) use of anticoagulants: Secondary | ICD-10-CM

## 2010-11-21 DIAGNOSIS — Z5181 Encounter for therapeutic drug level monitoring: Secondary | ICD-10-CM

## 2010-11-21 DIAGNOSIS — I4891 Unspecified atrial fibrillation: Secondary | ICD-10-CM

## 2010-11-21 NOTE — Patient Instructions (Signed)
Continue current dose, check in 4 weeks  

## 2010-11-28 ENCOUNTER — Other Ambulatory Visit: Payer: Self-pay | Admitting: *Deleted

## 2010-11-28 MED ORDER — TRAMADOL HCL 50 MG PO TABS: 50.0000 mg | ORAL_TABLET | Freq: Four times a day (QID) | ORAL | Status: DC | PRN

## 2010-11-28 NOTE — Telephone Encounter (Signed)
Form on your desk  

## 2010-11-28 NOTE — Telephone Encounter (Signed)
rx sent to pharmacy

## 2010-11-28 NOTE — Telephone Encounter (Signed)
Okay #60 x 1 

## 2010-12-14 ENCOUNTER — Encounter: Payer: Self-pay | Admitting: Internal Medicine

## 2010-12-17 ENCOUNTER — Encounter: Payer: Self-pay | Admitting: Internal Medicine

## 2010-12-17 ENCOUNTER — Ambulatory Visit (INDEPENDENT_AMBULATORY_CARE_PROVIDER_SITE_OTHER): Payer: Medicare Other | Admitting: Internal Medicine

## 2010-12-17 VITALS — BP 128/70 | HR 58 | Temp 97.9°F | Ht 67.0 in | Wt 207.0 lb

## 2010-12-17 DIAGNOSIS — M1990 Unspecified osteoarthritis, unspecified site: Secondary | ICD-10-CM

## 2010-12-17 DIAGNOSIS — I5022 Chronic systolic (congestive) heart failure: Secondary | ICD-10-CM

## 2010-12-17 DIAGNOSIS — I4891 Unspecified atrial fibrillation: Secondary | ICD-10-CM

## 2010-12-17 DIAGNOSIS — K219 Gastro-esophageal reflux disease without esophagitis: Secondary | ICD-10-CM

## 2010-12-17 DIAGNOSIS — I2510 Atherosclerotic heart disease of native coronary artery without angina pectoris: Secondary | ICD-10-CM

## 2010-12-17 DIAGNOSIS — Z5181 Encounter for therapeutic drug level monitoring: Secondary | ICD-10-CM

## 2010-12-17 DIAGNOSIS — Z7901 Long term (current) use of anticoagulants: Secondary | ICD-10-CM

## 2010-12-17 DIAGNOSIS — I10 Essential (primary) hypertension: Secondary | ICD-10-CM

## 2010-12-17 LAB — CBC WITH DIFFERENTIAL/PLATELET
Basophils Relative: 0.5 % (ref 0.0–3.0)
Eosinophils Relative: 0.5 % (ref 0.0–5.0)
HCT: 34.4 % — ABNORMAL LOW (ref 36.0–46.0)
Lymphs Abs: 1.3 10*3/uL (ref 0.7–4.0)
MCHC: 34 g/dL (ref 30.0–36.0)
MCV: 87.1 fl (ref 78.0–100.0)
Monocytes Absolute: 0.3 10*3/uL (ref 0.1–1.0)
Platelets: 120 10*3/uL — ABNORMAL LOW (ref 150.0–400.0)
RBC: 3.95 Mil/uL (ref 3.87–5.11)
WBC: 3.5 10*3/uL — ABNORMAL LOW (ref 4.5–10.5)

## 2010-12-17 LAB — BASIC METABOLIC PANEL
BUN: 17 mg/dL (ref 6–23)
Calcium: 8.9 mg/dL (ref 8.4–10.5)
GFR: 60.61 mL/min (ref >60.00)
Glucose, Bld: 88 mg/dL (ref 70–99)

## 2010-12-17 LAB — POCT INR: INR: 2.3

## 2010-12-17 NOTE — Patient Instructions (Signed)
Continue current dose and return in 4 weeks.  

## 2010-12-17 NOTE — Progress Notes (Signed)
Subjective:    Patient ID: Deborah Conway, female    DOB: [name] 14, 1922, 77 y.o.   MRN: [id number]  HPI Doing okay Has had to take extra fluid pills due to increased edema Weight at home up to 207#---she knows to increase meds then  Breathing has been fairly good Feels that heart is regular Only occ chest pain---NTG only twice in the past 4 months (only if the pain persists---often pain is very brief)  Has been walking better Tramadol bid really helps her arthritis pain  Stomach okay Does get some water brash but no sig heartburn issues  Current outpatient prescriptions:amiodarone (PACERONE) 200 MG tablet, Take 200 mg by mouth daily.  , Disp: , Rfl: ;  aspirin 81 MG tablet, Take 81 mg by mouth daily.  , Disp: , Rfl: ;  fish oil-omega-3 fatty acids 1000 MG capsule, Take 2 g by mouth daily.  , Disp: , Rfl: ;  furosemide (LASIX) 20 MG tablet, Take 20 mg by mouth daily.  , Disp: , Rfl: ;  isosorbide mononitrate (IMDUR) 30 MG 24 hr tablet, Take 30 mg by mouth daily.  , Disp: , Rfl:  metoprolol (LOPRESSOR) 100 MG tablet, take 1/2 in the AM take 1/2 at lunch and take 1 by mouth at bedtime , Disp: , Rfl: ;  nitroGLYCERIN (NITROSTAT) 0.4 MG SL tablet, Place 0.4 mg under the tongue every 5 (five) minutes as needed.  , Disp: , Rfl: ;  omeprazole (PRILOSEC) 20 MG capsule, Take 20 mg by mouth 2 (two) times daily.  , Disp: , Rfl: ;  potassium chloride (K-DUR) 10 MEQ tablet, Take 10 mEq by mouth daily.  , Disp: , Rfl:  traMADol (ULTRAM) 50 MG tablet, Take 1 tablet (50 mg total) by mouth every 6 (six) hours as needed., Disp: 60 tablet, Rfl: 1;  warfarin (COUMADIN) 1 MG tablet, Take 1 mg by mouth as directed.  , Disp: , Rfl: ;  warfarin (COUMADIN) 5 MG tablet, Take 5 mg by mouth daily.  , Disp: , Rfl:   Past Medical History  Diagnosis Date  . History of colon cancer   . CAD (coronary artery disease)   . GERD (gastroesophageal reflux disease)   . Hyperlipidemia   . Hypertension   . Rheumatoid arthritis     . Osteoarthritis   . Atrial fibrillation   . Stroke   . Pulmonary hypertension   . CHF (congestive heart failure)   . Chest pain     5/09 Chest pain--cath at [facility] shows patent stent and no sig obstructive disease  . SOB (shortness of breath)     Admitted with SOB 11/10-------------severe pulm HTN found on echo    Past Surgical History  Procedure Date  . Appendectomy   . Tonsillectomy   . Carotid stent   . Partial colectomy 1989    colon  . Vaginal delivery     x1  . Cataract extraction 2007    left  . Total knee arthroplasty 11/09    right  . Ablation of dysrhythmic focus 11/11    Ablation for atrial flutter  . Cardiac catheterization     Cath severe MR good LV EF, mild CAD 05/2005    Family History  Problem Relation Age of Onset  . Heart disease Mother   . Heart disease Father   . Diabetes Son   . Cancer Paternal Aunt     colon cancer  . Cancer Brother     lung cancer  History   Social History  . Marital Status: Widowed    Spouse Name: N/A    Number of Children: 1  . Years of Education: N/A   Occupational History  . retiredTEFL teacher    Social History Main Topics  . Smoking status: Never Smoker   . Smokeless tobacco: Not on file  . Alcohol Use: No  . Drug Use: No  . Sexually Active: Not on file   Other Topics Concern  . Not on file   Social History Narrative   Son Ree Kida has health care POA. WOuld accept resuscitation but doesn't want any prolonged artifical respiration or machines   Review of Systems Weight is stable Sleeping well Mood has been fine     Objective:   Physical Exam  Constitutional: She appears well-developed and well-nourished. No distress.  Neck: Normal range of motion. Neck supple. No thyromegaly present.  Cardiovascular: Normal rate, regular rhythm and normal heart sounds.  Exam reveals no gallop.   No murmur heard. Pulmonary/Chest: Effort normal and breath sounds normal. No respiratory distress. She has no wheezes. She  has no rales.  Abdominal: Soft. There is no tenderness.  Musculoskeletal: She exhibits no tenderness.       No sig edema--calves slightly tight  Lymphadenopathy:    She has no cervical adenopathy.  Psychiatric: She has a normal mood and affect. Her behavior is normal. Judgment and thought content normal.          Assessment & Plan:

## 2010-12-20 ENCOUNTER — Encounter: Payer: Self-pay | Admitting: *Deleted

## 2011-01-21 ENCOUNTER — Ambulatory Visit (INDEPENDENT_AMBULATORY_CARE_PROVIDER_SITE_OTHER): Payer: Medicare Other | Admitting: Internal Medicine

## 2011-01-21 DIAGNOSIS — Z5181 Encounter for therapeutic drug level monitoring: Secondary | ICD-10-CM

## 2011-01-21 DIAGNOSIS — I4891 Unspecified atrial fibrillation: Secondary | ICD-10-CM

## 2011-01-21 DIAGNOSIS — Z7901 Long term (current) use of anticoagulants: Secondary | ICD-10-CM

## 2011-01-21 LAB — POCT INR: INR: 1.7

## 2011-01-21 NOTE — Patient Instructions (Signed)
Take 7.5 mg tonight then resume 2.5 mg daily, recheck 2 weeks

## 2011-01-31 ENCOUNTER — Other Ambulatory Visit: Payer: Self-pay | Admitting: *Deleted

## 2011-01-31 MED ORDER — TRAMADOL HCL 50 MG PO TABS
50.0000 mg | ORAL_TABLET | Freq: Four times a day (QID) | ORAL | Status: DC | PRN
Start: 2011-01-31 — End: 2011-05-07

## 2011-01-31 NOTE — Telephone Encounter (Signed)
Faxed request from [facility] is on your desk. 

## 2011-01-31 NOTE — Telephone Encounter (Signed)
Okay #60 x 1 

## 2011-01-31 NOTE — Telephone Encounter (Signed)
rx faxed to pharmacy manually  

## 2011-02-04 ENCOUNTER — Ambulatory Visit (INDEPENDENT_AMBULATORY_CARE_PROVIDER_SITE_OTHER): Payer: Medicare Other | Admitting: Internal Medicine

## 2011-02-04 DIAGNOSIS — Z5181 Encounter for therapeutic drug level monitoring: Secondary | ICD-10-CM

## 2011-02-04 DIAGNOSIS — I4891 Unspecified atrial fibrillation: Secondary | ICD-10-CM

## 2011-02-04 DIAGNOSIS — Z7901 Long term (current) use of anticoagulants: Secondary | ICD-10-CM

## 2011-02-04 NOTE — Patient Instructions (Signed)
resume 5 mg daily, recheck 4 weeks

## 2011-02-14 ENCOUNTER — Other Ambulatory Visit: Payer: Self-pay | Admitting: *Deleted

## 2011-02-14 MED ORDER — FUROSEMIDE 20 MG PO TABS: 20.0000 mg | ORAL_TABLET | Freq: Every day | ORAL | Status: DC

## 2011-02-14 NOTE — Telephone Encounter (Signed)
Opened in error

## 2011-02-14 NOTE — Telephone Encounter (Signed)
rx sent to pharmacy by e-script  

## 2011-02-25 ENCOUNTER — Encounter: Payer: Self-pay | Admitting: Internal Medicine

## 2011-02-25 ENCOUNTER — Ambulatory Visit (INDEPENDENT_AMBULATORY_CARE_PROVIDER_SITE_OTHER): Payer: Medicare Other | Admitting: Internal Medicine

## 2011-02-25 DIAGNOSIS — I5023 Acute on chronic systolic (congestive) heart failure: Secondary | ICD-10-CM

## 2011-02-25 DIAGNOSIS — I4891 Unspecified atrial fibrillation: Secondary | ICD-10-CM

## 2011-02-25 NOTE — Assessment & Plan Note (Signed)
Now seems to have class 3 symptoms with clear worsening of limited exercise tolerance and PND More symptomatic with rapid atrial fib by her history so may have component of diastolic failure as well--especially since her weight is down She needs to get back with her [facility] cardiologist this week

## 2011-02-25 NOTE — Assessment & Plan Note (Signed)
Clearly in atrial fib now This may be the main reason for her CHF worsening Will have her check back with cardiologist (??add digoxin --would leave to them since she is on amiodarone) Rate is on low side so I am concerned about increasing beta blocker also

## 2011-02-25 NOTE — Progress Notes (Signed)
Subjective:    Patient ID: Deborah Conway, female    DOB: 1921/09/18, 77 y.o.   MRN: [id number]  HPI Has not been "quite myself for a few days" Severe DOE--just walking a short distance GD, [name], who is nurse, could hear fluid in her chest Furosemide increased to 40mg  daily with repeat if weight up some Somewhat better today  Weight is down as low as it has been in some time Mild ankle swelling but no worse Avoids salt  Has had some chest pain Has needed nitro 3 times in the past 2 weeks Does note fast heart beat at times----will break out in sweat. 3-4 episodes in past few weeks--will take aspirin and nitro and will eventually ease up May last 30 minutes or slightly longer. Really wears her out  Started with bad fall ~ 4-5 weeks ago with extensive bruising on chest This seems to be healing and now able to breathe without pain  Having trouble sleeping Has needed air conditioning up Has been sleeping in bed---sleeps up on pillows but still having some PND  Current Outpatient Prescriptions on File Prior to Visit  Medication Sig Dispense Refill  . amiodarone (PACERONE) 200 MG tablet Take 200 mg by mouth daily.        Marland Kitchen aspirin 81 MG tablet Take 81 mg by mouth daily.        . fish oil-omega-3 fatty acids 1000 MG capsule Take 2 g by mouth daily.        . isosorbide mononitrate (IMDUR) 30 MG 24 hr tablet Take 30 mg by mouth daily.        . metoprolol (LOPRESSOR) 100 MG tablet take 1/2 in the AM take 1/2 at lunch and take 1 by mouth at bedtime       . nitroGLYCERIN (NITROSTAT) 0.4 MG SL tablet Place 0.4 mg under the tongue every 5 (five) minutes as needed.        Marland Kitchen omeprazole (PRILOSEC) 20 MG capsule Take 20 mg by mouth 2 (two) times daily.        . potassium chloride (K-DUR) 10 MEQ tablet Take 10 mEq by mouth daily.        . traMADol (ULTRAM) 50 MG tablet Take 1 tablet (50 mg total) by mouth every 6 (six) hours as needed.  60 tablet  1  . warfarin (COUMADIN) 1 MG tablet Take 1 mg by  mouth as directed.        . warfarin (COUMADIN) 5 MG tablet Take 5 mg by mouth daily.          Allergies  Allergen Reactions  . Ezetimibe     REACTION: sever myalgias  . Ibuprofen   . Rosuvastatin     REACTION: leg cramps  . Simvastatin     REACTION: leg cramps    Past Medical History  Diagnosis Date  . History of colon cancer   . CAD (coronary artery disease)   . GERD (gastroesophageal reflux disease)   . Hyperlipidemia   . Hypertension   . Rheumatoid arthritis   . Osteoarthritis   . Atrial fibrillation   . Stroke   . Pulmonary hypertension   . CHF (congestive heart failure)   . Chest pain     5/09 Chest pain--cath at [facility] shows patent stent and no sig obstructive disease  . SOB (shortness of breath)     Admitted with SOB 11/10-------------severe pulm HTN found on echo    Past Surgical History  Procedure Date  .  Appendectomy   . Tonsillectomy   . Carotid stent   . Partial colectomy 1989    colon  . Vaginal delivery     x1  . Cataract extraction 2007    left  . Total knee arthroplasty 11/09    right  . Ablation of dysrhythmic focus 11/11    Ablation for atrial flutter  . Cardiac catheterization     Cath severe MR good LV EF, mild CAD 05/2005    Family History  Problem Relation Age of Onset  . Heart disease Mother   . Heart disease Father   . Diabetes Son   . Cancer Paternal Aunt     colon cancer  . Cancer Brother     lung cancer    History   Social History  . Marital Status: Widowed    Spouse Name: N/A    Number of Children: 1  . Years of Education: N/A   Occupational History  . retiredTEFL teacher    Social History Main Topics  . Smoking status: Never Smoker   . Smokeless tobacco: Never Used  . Alcohol Use: No  . Drug Use: No  . Sexually Active: Not on file   Other Topics Concern  . Not on file   Social History Narrative   Son Ree Kida has health care POA. WOuld accept resuscitation but doesn't want any prolonged artifical respiration or  machines   Review of Systems Stomach generally okay Needs miralax for bowels Appetite goes when heart is acting up     Objective:   Physical Exam  Constitutional: She appears well-developed and well-nourished. No distress.  Neck: Normal range of motion. Neck supple. No thyromegaly present.  Cardiovascular: Normal rate and normal heart sounds.  Exam reveals no gallop.   No murmur heard.      Heart is irregular now No S3  Musculoskeletal: She exhibits no edema and no tenderness.  Lymphadenopathy:    She has no cervical adenopathy.  Psychiatric: She has a normal mood and affect. Her behavior is normal. Judgment and thought content normal.          Assessment & Plan:

## 2011-02-25 NOTE — Patient Instructions (Signed)
Please call [facility] tomorrow to get appt with your cardiologist in the next few days

## 2011-02-26 ENCOUNTER — Telehealth: Payer: Self-pay | Admitting: *Deleted

## 2011-02-26 DIAGNOSIS — I5023 Acute on chronic systolic (congestive) heart failure: Secondary | ICD-10-CM

## 2011-02-26 DIAGNOSIS — I4891 Unspecified atrial fibrillation: Secondary | ICD-10-CM

## 2011-02-26 NOTE — Telephone Encounter (Signed)
Cardiology appt made with Dr Mariah Milling on 03/01/2011 , son Ree Kida was notified of this appt. MK

## 2011-02-26 NOTE — Telephone Encounter (Signed)
Granddaughter is asking for a referral for her grandmother to start going to [facility] in [place]. She says that it is such a big long drive for patient to have to go to [facility].

## 2011-03-01 ENCOUNTER — Ambulatory Visit (INDEPENDENT_AMBULATORY_CARE_PROVIDER_SITE_OTHER): Payer: Medicare Other | Admitting: Cardiovascular Disease

## 2011-03-01 ENCOUNTER — Encounter: Payer: Self-pay | Admitting: Cardiovascular Disease

## 2011-03-01 DIAGNOSIS — E785 Hyperlipidemia, unspecified: Secondary | ICD-10-CM

## 2011-03-01 DIAGNOSIS — I5032 Chronic diastolic (congestive) heart failure: Secondary | ICD-10-CM | POA: Insufficient documentation

## 2011-03-01 DIAGNOSIS — I272 Pulmonary hypertension, unspecified: Secondary | ICD-10-CM

## 2011-03-01 DIAGNOSIS — I2789 Other specified pulmonary heart diseases: Secondary | ICD-10-CM

## 2011-03-01 DIAGNOSIS — I2510 Atherosclerotic heart disease of native coronary artery without angina pectoris: Secondary | ICD-10-CM

## 2011-03-01 DIAGNOSIS — I10 Essential (primary) hypertension: Secondary | ICD-10-CM

## 2011-03-01 DIAGNOSIS — I4891 Unspecified atrial fibrillation: Secondary | ICD-10-CM

## 2011-03-01 DIAGNOSIS — I340 Nonrheumatic mitral (valve) insufficiency: Secondary | ICD-10-CM | POA: Insufficient documentation

## 2011-03-01 MED ORDER — POTASSIUM CHLORIDE ER 10 MEQ PO TBCR: 10.0000 meq | EXTENDED_RELEASE_TABLET | Freq: Two times a day (BID) | ORAL | Status: DC

## 2011-03-01 MED ORDER — FUROSEMIDE 40 MG PO TABS: 40.0000 mg | ORAL_TABLET | Freq: Two times a day (BID) | ORAL | Status: DC

## 2011-03-01 NOTE — Assessment & Plan Note (Addendum)
She is in normal sinus rhythm today but with frequent APCs. Rate is 55 beats per minute. No changes were made to her Rate and rhythm controlling medications.

## 2011-03-01 NOTE — Assessment & Plan Note (Signed)
Notes suggest severe MR which is probably contributing to her severe pulmonary hypertension. Also atrial fibrillation and ectopy would also contribute to her diastolic dysfunction. She reports weight gain, increased shortness of breath. We will increase her Lasix to 40 mg daily with extra 40 mg in the afternoon for shortness of breath. We will need to identify a good weight for her where she feels the best. At that time, if she is very symptomatic still, we might need to possibly consider intervention on her mitral valve.

## 2011-03-01 NOTE — Assessment & Plan Note (Signed)
Currently with no symptoms of angina. No further workup at this time. Continue current medication regimen. Will try to obtain the records from [facility].

## 2011-03-01 NOTE — Patient Instructions (Addendum)
Please increase the lasix to 40 mg twice a day.  When your swelling and shortness of breath improves, decrease the lasix back to one a day.  Take your potassium pill when you take lasix Please call us if you have new issues that need to be addressed before your next appt.  We will call you for a follow up Appt. In 2 weeks

## 2011-03-01 NOTE — Progress Notes (Signed)
Patient ID: Deborah Conway, female    DOB: 10-15-1921, 77 y.o.   MRN: [id number]  HPI Comments: 77 year old woman with a history of coronary artery disease, PCI in the past with catheterization in May 2009 showing stable patient stands, paroxysmal atrial fibrillation, severe pulmonary hypertension by echocardiography done at [facility], severe mitral valve regurgitation per the notes, long smoking history, not on inhalers, chronic shortness of breath that has been more severe recently who presents to establish care. She is a patient of Dr. Alphonsus Sias.  Her granddaughter presents with her today. She reports that she has been more short of breath over the past several weeks. She reports there was a disconnect between the suggested diuretic regiment from [facility] and what she is taking. They had suggested taking Lasix 40 mg daily with extra 40 mg as needed for shortness of breath. She is only been taking 20 mg that time, she does not have extra Lasix to take.  She has been trying to monitor her fluid intake. She is uncertain what a good weight for her is when she is euvolemic. She reports that no one has discussed possible options for her mitral valve regurgitation in the past and she felt that she was not a candidate for repair.  She does not appreciate any tachypalpitations though sometimes reports her heart rate is irregular when her granddaughter checks it. Her granddaughter is a Engineer, civil (consulting) in an oncology.  EKG shows normal sinus rhythm with frequent PACs, occasional pause of 1.4 seconds.   Outpatient Encounter Prescriptions as of 03/01/2011  Medication Sig Dispense Refill  . amiodarone (PACERONE) 200 MG tablet Take 200 mg by mouth daily.        Marland Kitchen aspirin 81 MG tablet Take 81 mg by mouth daily.        . fish oil-omega-3 fatty acids 1000 MG capsule Take 2 g by mouth daily.        . isosorbide mononitrate (IMDUR) 30 MG 24 hr tablet Take 30 mg by mouth daily.        . metoprolol (LOPRESSOR) 100 MG  tablet take 1/2 in the AM take 1/2 at lunch and take 1 by mouth at bedtime       . nitroGLYCERIN (NITROSTAT) 0.4 MG SL tablet Place 0.4 mg under the tongue every 5 (five) minutes as needed.        Marland Kitchen omeprazole (PRILOSEC) 20 MG capsule Take 20 mg by mouth 2 (two) times daily.        . traMADol (ULTRAM) 50 MG tablet Take 1 tablet (50 mg total) by mouth every 6 (six) hours as needed.  60 tablet  1  . warfarin (COUMADIN) 1 MG tablet Take 1 mg by mouth as directed.        . warfarin (COUMADIN) 5 MG tablet Take 5 mg by mouth daily.        .  furosemide (LASIX) 40 MG tablet Take 40 mg by mouth daily. If gain more than 2lbs in 3 days or 3lbs in 5 days take an extra dose       .  potassium chloride (K-DUR) 10 MEQ tablet Take 10 mEq by mouth daily.           Review of Systems  Constitutional: Positive for fatigue.  HENT: Negative.   Eyes: Negative.   Respiratory: Positive for shortness of breath.   Cardiovascular: Negative.   Gastrointestinal: Negative.   Musculoskeletal: Negative.   Skin: Negative.   Neurological: Positive for  weakness.  Hematological: Negative.   Psychiatric/Behavioral: Negative.   All other systems reviewed and are negative.    BP 130/72  Pulse 55  Ht 5\' 8"  (1.727 m)  Wt 206 lb (93.441 kg)  BMI 31.32 kg/m2  SpO2 96%  Physical Exam  Nursing note and vitals reviewed. Constitutional: She is oriented to person, place, and time. She appears well-developed and well-nourished.  HENT:  Head: Normocephalic.  Nose: Nose normal.  Mouth/Throat: Oropharynx is clear and moist.  Eyes: Conjunctivae are normal. Pupils are equal, round, and reactive to light.  Neck: Normal range of motion. Neck supple. No JVD present.  Cardiovascular: Normal rate, regular rhythm, S1 normal, S2 normal and intact distal pulses.  Exam reveals no gallop and no friction rub.   Murmur heard.  Crescendo systolic murmur is present with a grade of 2/6  Pulmonary/Chest: Effort normal and breath sounds  normal. No respiratory distress. She has no wheezes. She has no rales. She exhibits no tenderness.  Abdominal: Soft. Bowel sounds are normal. She exhibits no distension. There is no tenderness.  Musculoskeletal: Normal range of motion. She exhibits no edema and no tenderness.  Lymphadenopathy:    She has no cervical adenopathy.  Neurological: She is alert and oriented to person, place, and time. Coordination normal.  Skin: Skin is warm and dry. No rash noted. No erythema.  Psychiatric: She has a normal mood and affect. Her behavior is normal. Judgment and thought content normal.         Assessment and Plan

## 2011-03-01 NOTE — Assessment & Plan Note (Signed)
Notes indicate severe mitral valve regurgitation. Treatment would be aggressive diuresis for symptom relief. If she continues to have frequent problems with shortness of breath and CHF, I would refer her to come for possible minimally invasive valve repair. She may also be a candidate for percutaneous mitral valve.

## 2011-03-04 ENCOUNTER — Ambulatory Visit (INDEPENDENT_AMBULATORY_CARE_PROVIDER_SITE_OTHER): Payer: Medicare Other | Admitting: Family Medicine

## 2011-03-04 DIAGNOSIS — Z5181 Encounter for therapeutic drug level monitoring: Secondary | ICD-10-CM

## 2011-03-04 DIAGNOSIS — Z7901 Long term (current) use of anticoagulants: Secondary | ICD-10-CM

## 2011-03-04 DIAGNOSIS — I4891 Unspecified atrial fibrillation: Secondary | ICD-10-CM

## 2011-03-04 NOTE — Patient Instructions (Signed)
No change continue 5 mg daily, recheck 4 weeks

## 2011-03-15 ENCOUNTER — Ambulatory Visit: Payer: Medicare Other | Admitting: Cardiovascular Disease

## 2011-03-18 ENCOUNTER — Encounter: Payer: Self-pay | Admitting: Cardiovascular Disease

## 2011-03-18 ENCOUNTER — Ambulatory Visit (INDEPENDENT_AMBULATORY_CARE_PROVIDER_SITE_OTHER): Payer: Medicare Other | Admitting: Cardiovascular Disease

## 2011-03-18 DIAGNOSIS — E785 Hyperlipidemia, unspecified: Secondary | ICD-10-CM

## 2011-03-18 DIAGNOSIS — I4891 Unspecified atrial fibrillation: Secondary | ICD-10-CM

## 2011-03-18 DIAGNOSIS — I2510 Atherosclerotic heart disease of native coronary artery without angina pectoris: Secondary | ICD-10-CM

## 2011-03-18 DIAGNOSIS — I059 Rheumatic mitral valve disease, unspecified: Secondary | ICD-10-CM

## 2011-03-18 DIAGNOSIS — I10 Essential (primary) hypertension: Secondary | ICD-10-CM

## 2011-03-18 DIAGNOSIS — I340 Nonrheumatic mitral (valve) insufficiency: Secondary | ICD-10-CM

## 2011-03-18 DIAGNOSIS — I5032 Chronic diastolic (congestive) heart failure: Secondary | ICD-10-CM

## 2011-03-18 NOTE — Progress Notes (Signed)
Patient ID: Deborah Conway, female    DOB: 10-12-1921, 77 y.o.   MRN: [id number]  HPI Comments: 77 year old woman with a history of coronary artery disease, PCI in the past with catheterization in May 2009 showing stable disease, paroxysmal atrial fibrillation, severe pulmonary hypertension by echocardiography done at [facility], long smoking history, not on inhalers, chronic shortness of breath, Who presents for routine visit. She is a patient of Dr. Alphonsus Sias.  She has been taking Lasix 40 mg in the morning occasionally in the evening and has had improved shortness of breath. She has less swelling around her face and neck. Less abdominal distention. She takes the p.m. Lasix as needed. Her main complaint is her lower extremity weakness and chronic low back pain.   Echocardiogram from November 2011 shows mild aortic valve regurgitation, ejection fraction 35-40%.  EKG last visit shows normal sinus rhythm with frequent PACs, occasional pause of 1.4 seconds.   Outpatient Encounter Prescriptions as of 03/18/2011  Medication Sig Dispense Refill  . amiodarone (PACERONE) 200 MG tablet Take 200 mg by mouth daily.        Marland Kitchen aspirin 81 MG tablet Take 81 mg by mouth daily.        . fish oil-omega-3 fatty acids 1000 MG capsule Take 2 g by mouth daily.        . furosemide (LASIX) 40 MG tablet Take 1 tablet (40 mg total) by mouth 2 (two) times daily. If gain more than 2lbs in 3 days or 3lbs in 5 days take an extra dose  60 tablet  6  . isosorbide mononitrate (IMDUR) 30 MG 24 hr tablet Take 30 mg by mouth daily.        Marland Kitchen lisinopril (PRINIVIL,ZESTRIL) 5 MG tablet Take 5 mg by mouth daily.        . metoprolol (LOPRESSOR) 100 MG tablet take 1/2 in the AM take 1/2 at lunch and take 1 by mouth at bedtime       . nitroGLYCERIN (NITROSTAT) 0.4 MG SL tablet Place 0.4 mg under the tongue every 5 (five) minutes as needed.        Marland Kitchen omeprazole (PRILOSEC) 20 MG capsule Take 20 mg by mouth 2 (two) times daily.        .  potassium chloride (K-DUR) 10 MEQ tablet Take 1 tablet (10 mEq total) by mouth 2 (two) times daily.  60 tablet  6  . traMADol (ULTRAM) 50 MG tablet Take 1 tablet (50 mg total) by mouth every 6 (six) hours as needed.  60 tablet  1  . warfarin (COUMADIN) 1 MG tablet Take 1 mg by mouth as directed.        . warfarin (COUMADIN) 5 MG tablet Take 5 mg by mouth daily.           Review of Systems  Constitutional: Positive for fatigue.  HENT: Negative.   Eyes: Negative.   Respiratory: Positive for shortness of breath.   Cardiovascular: Negative.   Gastrointestinal: Negative.   Musculoskeletal: Negative.   Skin: Negative.   Neurological: Positive for weakness.  Hematological: Negative.   Psychiatric/Behavioral: Negative.   All other systems reviewed and are negative.    BP 168/80  Pulse 59  Ht 5\' 8"  (1.727 m)  Wt 205 lb (92.987 kg)  BMI 31.17 kg/m2 Repeat blood pressure 130/80.  Physical Exam  Nursing note and vitals reviewed. Constitutional: She is oriented to person, place, and time. She appears well-developed and well-nourished.  HENT:  Head:  Normocephalic.  Nose: Nose normal.  Mouth/Throat: Oropharynx is clear and moist.  Eyes: Conjunctivae are normal. Pupils are equal, round, and reactive to light.  Neck: Normal range of motion. Neck supple. No JVD present.  Cardiovascular: Normal rate, regular rhythm, S1 normal, S2 normal and intact distal pulses.  Exam reveals no gallop and no friction rub.   Murmur heard.  Crescendo systolic murmur is present with a grade of 2/6  Pulmonary/Chest: Effort normal and breath sounds normal. No respiratory distress. She has no wheezes. She has no rales. She exhibits no tenderness.  Abdominal: Soft. Bowel sounds are normal. She exhibits no distension. There is no tenderness.  Musculoskeletal: Normal range of motion. She exhibits no edema and no tenderness.  Lymphadenopathy:    She has no cervical adenopathy.  Neurological: She is alert and  oriented to person, place, and time. Coordination normal.  Skin: Skin is warm and dry. No rash noted. No erythema.  Psychiatric: She has a normal mood and affect. Her behavior is normal. Judgment and thought content normal.         Assessment and Plan

## 2011-03-18 NOTE — Assessment & Plan Note (Addendum)
Echocardiogram from late last year 2011 does not suggest elevated right ventricular systolic pressures. Edema is likely secondary to venous insufficiency. We have suggested she stay on Lasix 40 mg in the morning, additional Lasix in the p.m. As needed.

## 2011-03-18 NOTE — Patient Instructions (Signed)
You are doing well. No medication changes were made. Please continue to use your bike Monitor your blood pressure and heart rate Please call us if you have new issues that need to be addressed before your next appt.  We will call you for a follow up Appt. In 3 months

## 2011-03-18 NOTE — Assessment & Plan Note (Signed)
Echocardiogram from 2011 shows mild MR.

## 2011-03-18 NOTE — Assessment & Plan Note (Signed)
Currently with no symptoms of angina. No further workup at this time. Continue current medication regimen. 

## 2011-03-18 NOTE — Assessment & Plan Note (Signed)
Currently not on a cholesterol medication. It has not been tested in several years time.

## 2011-03-18 NOTE — Assessment & Plan Note (Signed)
EKG on her last visit showed normal sinus rhythm with frequent ectopy. Her last her to monitor her heart rate as she does have bradycardia today as well as on her last visit. She appears to be asymptomatic.

## 2011-03-18 NOTE — Assessment & Plan Note (Signed)
I have asked her to monitor her blood pressure and heart rate at home. Her granddaughter reports the patient has a labile pressure.

## 2011-04-01 ENCOUNTER — Ambulatory Visit (INDEPENDENT_AMBULATORY_CARE_PROVIDER_SITE_OTHER): Payer: Medicare Other | Admitting: Internal Medicine

## 2011-04-01 DIAGNOSIS — Z5181 Encounter for therapeutic drug level monitoring: Secondary | ICD-10-CM

## 2011-04-01 DIAGNOSIS — I4891 Unspecified atrial fibrillation: Secondary | ICD-10-CM

## 2011-04-01 DIAGNOSIS — Z7901 Long term (current) use of anticoagulants: Secondary | ICD-10-CM

## 2011-04-01 LAB — POCT INR: INR: 2

## 2011-04-01 NOTE — Patient Instructions (Signed)
contiune 5 mg daily, recheck 4 weeks

## 2011-04-22 ENCOUNTER — Ambulatory Visit: Payer: Medicare Other | Admitting: Internal Medicine

## 2011-04-23 ENCOUNTER — Ambulatory Visit: Payer: Medicare Other | Admitting: Internal Medicine

## 2011-04-29 ENCOUNTER — Ambulatory Visit: Payer: Medicare Other

## 2011-04-30 ENCOUNTER — Ambulatory Visit (INDEPENDENT_AMBULATORY_CARE_PROVIDER_SITE_OTHER): Payer: Medicare Other | Admitting: Internal Medicine

## 2011-04-30 ENCOUNTER — Encounter: Payer: Self-pay | Admitting: Internal Medicine

## 2011-04-30 DIAGNOSIS — M1990 Unspecified osteoarthritis, unspecified site: Secondary | ICD-10-CM

## 2011-04-30 DIAGNOSIS — Z5181 Encounter for therapeutic drug level monitoring: Secondary | ICD-10-CM

## 2011-04-30 DIAGNOSIS — Z7901 Long term (current) use of anticoagulants: Secondary | ICD-10-CM

## 2011-04-30 DIAGNOSIS — K219 Gastro-esophageal reflux disease without esophagitis: Secondary | ICD-10-CM

## 2011-04-30 DIAGNOSIS — I4891 Unspecified atrial fibrillation: Secondary | ICD-10-CM

## 2011-04-30 DIAGNOSIS — I5022 Chronic systolic (congestive) heart failure: Secondary | ICD-10-CM

## 2011-04-30 LAB — POCT INR: INR: 2.5

## 2011-04-30 NOTE — Assessment & Plan Note (Signed)
Back in sinus rhythm Should help CHF but still with night symptoms Daytime walking is better

## 2011-04-30 NOTE — Patient Instructions (Signed)
contiune 5 mg daily, recheck 4 weeks 

## 2011-04-30 NOTE — Assessment & Plan Note (Signed)
Seems to have ongoing symptoms despite the bid omeprazole Will consider changing PPI Doesn't eat in evening Keeps HOB up, etc

## 2011-04-30 NOTE — Assessment & Plan Note (Signed)
Does okay with tramadol

## 2011-04-30 NOTE — Assessment & Plan Note (Signed)
Weight is stable Has regular PND--but along with throat symptoms this could be reflux related No changes now

## 2011-04-30 NOTE — Progress Notes (Signed)
Subjective:    Patient ID: KIANA HOLLAR, female    DOB: May 15, 1922, 77 y.o.   MRN: [id number]  HPI Here with granddaughter  Happy with Dr Mariah Milling Is checking weight and BP daily Very mild fluctuations in weight Breathing has been okay---still gets dyspneic with exertion and occ at rest PND most nights---oxygen helps Edema has been reasonably controlled--uses 2nd lasix most days now Last nitro about 1 month ago Occ palpitations but clearly less noticeable lately  Has feeling in throat---"like a frog in my throat" Feels tight occ trouble swallowing Still on omeprazole daily---some regurgitation but no sig heartburn  Uses the tramadol regularly---usually bid This helps the arthritis pain  Current Outpatient Prescriptions on File Prior to Visit  Medication Sig Dispense Refill  . amiodarone (PACERONE) 200 MG tablet Take 200 mg by mouth daily.        Marland Kitchen aspirin 81 MG tablet Take 81 mg by mouth daily.        . fish oil-omega-3 fatty acids 1000 MG capsule Take 2 g by mouth daily.        . furosemide (LASIX) 40 MG tablet Take 1 tablet (40 mg total) by mouth 2 (two) times daily. If gain more than 2lbs in 3 days or 3lbs in 5 days take an extra dose  60 tablet  6  . isosorbide mononitrate (IMDUR) 30 MG 24 hr tablet Take 30 mg by mouth daily.        Marland Kitchen lisinopril (PRINIVIL,ZESTRIL) 5 MG tablet Take 5 mg by mouth daily.        . metoprolol (LOPRESSOR) 100 MG tablet take 1/2 in the AM take 1/2 at lunch and take 1 by mouth at bedtime       . nitroGLYCERIN (NITROSTAT) 0.4 MG SL tablet Place 0.4 mg under the tongue every 5 (five) minutes as needed.        Marland Kitchen omeprazole (PRILOSEC) 20 MG capsule Take 20 mg by mouth 2 (two) times daily.        . potassium chloride (K-DUR) 10 MEQ tablet Take 1 tablet (10 mEq total) by mouth 2 (two) times daily.  60 tablet  6  . traMADol (ULTRAM) 50 MG tablet Take 1 tablet (50 mg total) by mouth every 6 (six) hours as needed.  60 tablet  1  . warfarin (COUMADIN) 1 MG  tablet Take 1 mg by mouth as directed.        . warfarin (COUMADIN) 5 MG tablet Take 5 mg by mouth daily.          Allergies  Allergen Reactions  . Ezetimibe     REACTION: sever myalgias  . Ibuprofen   . Rosuvastatin     REACTION: leg cramps  . Simvastatin     REACTION: leg cramps    Past Medical History  Diagnosis Date  . History of colon cancer   . CAD (coronary artery disease)   . GERD (gastroesophageal reflux disease)   . Hyperlipidemia   . Hypertension   . Rheumatoid arthritis   . Osteoarthritis   . Atrial fibrillation   . Stroke   . Pulmonary hypertension   . CHF (congestive heart failure)   . Chest pain     5/09 Chest pain--cath at [facility] shows patent stent and no sig obstructive disease  . SOB (shortness of breath)     Admitted with SOB 11/10-------------severe pulm HTN found on echo    Past Surgical History  Procedure Date  . Appendectomy   .  Tonsillectomy   . Carotid stent   . Partial colectomy 1989    colon  . Vaginal delivery     x1  . Cataract extraction 2007    left  . Total knee arthroplasty 11/09    right  . Ablation of dysrhythmic focus 11/11    Ablation for atrial flutter  . Cardiac catheterization     Cath severe MR good LV EF, mild CAD 05/2005    Family History  Problem Relation Age of Onset  . Heart disease Mother   . Heart disease Father   . Diabetes Son   . Cancer Paternal Aunt     colon cancer  . Cancer Brother     lung cancer    History   Social History  . Marital Status: Widowed    Spouse Name: N/A    Number of Children: 1  . Years of Education: N/A   Occupational History  . retiredTEFL teacher    Social History Main Topics  . Smoking status: Never Smoker   . Smokeless tobacco: Never Used  . Alcohol Use: No  . Drug Use: No  . Sexually Active: Not on file   Other Topics Concern  . Not on file   Social History Narrative   Son Ree Kida has health care POA. WOuld accept resuscitation but doesn't want any prolonged  artifical respiration or machines   Review of Systems Has used motion sickness pill at times with sensation in chest (relieves the sensation and nausea) Trouble sleeping Appetite is okay Ongoing balance problems Now getting Meals on Wheels    Objective:   Physical Exam  Constitutional: She appears well-developed and well-nourished. No distress.  Neck: Normal range of motion. No thyromegaly present.  Cardiovascular: Normal rate, regular rhythm and normal heart sounds.  Exam reveals no gallop.   No murmur heard. Pulmonary/Chest: Effort normal and breath sounds normal. No respiratory distress. She has no wheezes. She has no rales.  Abdominal: Soft. There is no tenderness.  Musculoskeletal: She exhibits no edema and no tenderness.  Lymphadenopathy:    She has no cervical adenopathy.  Psychiatric: She has a normal mood and affect. Her behavior is normal. Judgment and thought content normal.          Assessment & Plan:

## 2011-04-30 NOTE — Patient Instructions (Signed)
Please call if the throat symptoms and nighttime awakening don't improve.

## 2011-05-07 ENCOUNTER — Other Ambulatory Visit: Payer: Self-pay | Admitting: *Deleted

## 2011-05-07 MED ORDER — TRAMADOL HCL 50 MG PO TABS: 50.0000 mg | ORAL_TABLET | Freq: Four times a day (QID) | ORAL | Status: DC | PRN

## 2011-05-07 NOTE — Telephone Encounter (Signed)
Rx done electronically 

## 2011-05-07 NOTE — Telephone Encounter (Signed)
Rx refill in your IN box.

## 2011-05-09 ENCOUNTER — Other Ambulatory Visit: Payer: Self-pay | Admitting: *Deleted

## 2011-05-09 ENCOUNTER — Telehealth: Payer: Self-pay | Admitting: Internal Medicine

## 2011-05-09 MED ORDER — TRAMADOL HCL 50 MG PO TABS: 50.0000 mg | ORAL_TABLET | Freq: Four times a day (QID) | ORAL | Status: DC | PRN

## 2011-05-09 NOTE — Telephone Encounter (Signed)
rx sent to pharmacy by e-script  

## 2011-05-09 NOTE — Telephone Encounter (Signed)
Pts granddaughter called, says pharmacy did not receive the medication send on Oct 2th, Ultram. She asked if medication could be sent again Medic[facility]...Marland KitchenMarland KitchenMarland Kitchencdavis

## 2011-05-28 ENCOUNTER — Ambulatory Visit (INDEPENDENT_AMBULATORY_CARE_PROVIDER_SITE_OTHER): Payer: Medicare Other

## 2011-05-28 ENCOUNTER — Ambulatory Visit (INDEPENDENT_AMBULATORY_CARE_PROVIDER_SITE_OTHER): Payer: Medicare Other | Admitting: Internal Medicine

## 2011-05-28 DIAGNOSIS — Z23 Encounter for immunization: Secondary | ICD-10-CM

## 2011-05-28 DIAGNOSIS — I4891 Unspecified atrial fibrillation: Secondary | ICD-10-CM

## 2011-05-28 DIAGNOSIS — Z7901 Long term (current) use of anticoagulants: Secondary | ICD-10-CM

## 2011-05-28 DIAGNOSIS — Z5181 Encounter for therapeutic drug level monitoring: Secondary | ICD-10-CM

## 2011-05-28 NOTE — Patient Instructions (Signed)
Continue current dose, check in 4 weeks  

## 2011-06-13 ENCOUNTER — Encounter: Payer: Self-pay | Admitting: Cardiovascular Disease

## 2011-06-17 ENCOUNTER — Encounter: Payer: Self-pay | Admitting: Cardiovascular Disease

## 2011-06-17 ENCOUNTER — Other Ambulatory Visit: Payer: Self-pay

## 2011-06-17 ENCOUNTER — Ambulatory Visit (INDEPENDENT_AMBULATORY_CARE_PROVIDER_SITE_OTHER): Payer: Medicare Other | Admitting: Cardiovascular Disease

## 2011-06-17 DIAGNOSIS — E785 Hyperlipidemia, unspecified: Secondary | ICD-10-CM

## 2011-06-17 DIAGNOSIS — I5022 Chronic systolic (congestive) heart failure: Secondary | ICD-10-CM

## 2011-06-17 DIAGNOSIS — I2510 Atherosclerotic heart disease of native coronary artery without angina pectoris: Secondary | ICD-10-CM

## 2011-06-17 DIAGNOSIS — I4891 Unspecified atrial fibrillation: Secondary | ICD-10-CM

## 2011-06-17 DIAGNOSIS — I059 Rheumatic mitral valve disease, unspecified: Secondary | ICD-10-CM

## 2011-06-17 DIAGNOSIS — I5023 Acute on chronic systolic (congestive) heart failure: Secondary | ICD-10-CM

## 2011-06-17 DIAGNOSIS — R55 Syncope and collapse: Secondary | ICD-10-CM

## 2011-06-17 DIAGNOSIS — I5032 Chronic diastolic (congestive) heart failure: Secondary | ICD-10-CM

## 2011-06-17 DIAGNOSIS — I340 Nonrheumatic mitral (valve) insufficiency: Secondary | ICD-10-CM

## 2011-06-17 DIAGNOSIS — I10 Essential (primary) hypertension: Secondary | ICD-10-CM

## 2011-06-17 DIAGNOSIS — G459 Transient cerebral ischemic attack, unspecified: Secondary | ICD-10-CM

## 2011-06-17 MED ORDER — ASPIRIN 81 MG PO TABS: 81.0000 mg | ORAL_TABLET | Freq: Every day | ORAL | Status: AC

## 2011-06-17 NOTE — Assessment & Plan Note (Signed)
No recent cholesterol numbers measured

## 2011-06-17 NOTE — Patient Instructions (Signed)
You are doing well. No medication changes were made.  If you feel dizzy, tired, then do not take diuretic that day. Also do not take the evening blood pressure pill  Please call us if you have new issues that need to be addressed before your next appt.  The office will contact you for a follow up Appt. In 6 months

## 2011-06-17 NOTE — Assessment & Plan Note (Addendum)
Maintaining normal sinus rhythm with APCs. Currently on warfarin. She does have poor balance though no recent falls. We will need to watch this closely

## 2011-06-17 NOTE — Assessment & Plan Note (Signed)
She reports that she is doing much better. I am concerned about periodic hypotension that she has measured. On these days, we have suggested she hold her blood pressure and diuretic pill, particularly in the evening.

## 2011-06-17 NOTE — Assessment & Plan Note (Signed)
In general, blood pressure appears relatively well controlled with occasional periods of low systolic pressures. She does measure her blood pressure prior to her evening pills. We have suggested if her pressure does run low, to hold her evening medications and check the blood pressure again in the morning.

## 2011-06-17 NOTE — Progress Notes (Signed)
Patient ID: Deborah Conway, female    DOB: [birth year], 77 y.o.   MRN: [id number]  HPI Comments: 77 year old woman with a history of coronary artery disease, PCI in the past with catheterization in May 2009 showing stable disease, paroxysmal atrial fibrillation, severe pulmonary hypertension by echocardiography done at [facility], long smoking history, not on inhalers, chronic shortness of breath, Who presents for routine visit. She is a patient of Dr. Alphonsus Sias.  On her previous visits, She was taking Lasix 40 mg in the morning occasionally in the evening with improved shortness of breath. She hasd less swelling around her face and neck. Less abdominal distention. She takes the p.m. Lasix as needed.   Today she reports no significant edema. She does report having unsteady gait and balance. Her arthritic back pain is worse. She takes tramadol but this does not help her pain. She did take a Percocet provided by a family member and this did seem to help her pain significantly. He does not report any gait instability on Percocet x1. She wonders whether she should stop tramadol as it does not seem to be working. No SOB. She does have occasional episodes of dizziness and general malaise. On some of these days, her blood pressure is low with systolic pressure close to 100. Most other days her blood pressure is adequate in the 120-140 range.   Echocardiogram from November 2011 shows mild aortic valve regurgitation, ejection fraction 35-40%.  EKG last visit shows normal sinus rhythm with Rate of 56 beats per minute, frequent APCs frequent PACs   Outpatient Encounter Prescriptions as of 06/17/2011  Medication Sig Dispense Refill  . amiodarone (PACERONE) 200 MG tablet Take 200 mg by mouth daily.        . fish oil-omega-3 fatty acids 1000 MG capsule Take 2 g by mouth daily.        . furosemide (LASIX) 40 MG tablet Take 1 tablet (40 mg total) by mouth 2 (two) times daily. If gain more than 2lbs in 3 days or  3lbs in 5 days take an extra dose  60 tablet  6  . isosorbide mononitrate (IMDUR) 30 MG 24 hr tablet Take 30 mg by mouth daily.        Marland Kitchen lisinopril (PRINIVIL,ZESTRIL) 5 MG tablet Take 5 mg by mouth daily.        . metoprolol (LOPRESSOR) 100 MG tablet take 1/2 in the AM take 1/2 at lunch and take 1 by mouth at bedtime       . nitroGLYCERIN (NITROSTAT) 0.4 MG SL tablet Place 0.4 mg under the tongue every 5 (five) minutes as needed.        Marland Kitchen omeprazole (PRILOSEC) 20 MG capsule Take 20 mg by mouth 2 (two) times daily.        . potassium chloride (K-DUR) 10 MEQ tablet Take 1 tablet (10 mEq total) by mouth 2 (two) times daily.  60 tablet  6  . traMADol (ULTRAM) 50 MG tablet Take 1 tablet (50 mg total) by mouth every 6 (six) hours as needed.  60 tablet  1  . warfarin (COUMADIN) 1 MG tablet Take 1 mg by mouth as directed.        . warfarin (COUMADIN) 5 MG tablet Take 5 mg by mouth daily.        Marland Kitchen DISCONTD: aspirin 81 MG tablet Take 81 mg by mouth daily.           Review of Systems  HENT: Negative.  Eyes: Negative.   Cardiovascular: Negative.   Gastrointestinal: Negative.   Musculoskeletal: Positive for back pain and gait problem.  Skin: Negative.   Hematological: Negative.   Psychiatric/Behavioral: Negative.   All other systems reviewed and are negative.    BP 148/72  Pulse 56  Ht 5\' 8"  (1.727 m)  Wt 207 lb (93.895 kg)  BMI 31.47 kg/m2  Physical Exam  Nursing note and vitals reviewed. Constitutional: She is oriented to person, place, and time. She appears well-developed and well-nourished.  HENT:  Head: Normocephalic.  Nose: Nose normal.  Mouth/Throat: Oropharynx is clear and moist.  Eyes: Conjunctivae are normal. Pupils are equal, round, and reactive to light.  Neck: Normal range of motion. Neck supple. No JVD present.  Cardiovascular: Normal rate, regular rhythm, S1 normal, S2 normal and intact distal pulses.  Exam reveals no gallop and no friction rub.   Murmur heard.   Crescendo systolic murmur is present with a grade of 2/6  Pulmonary/Chest: Effort normal and breath sounds normal. No respiratory distress. She has no wheezes. She has no rales. She exhibits no tenderness.  Abdominal: Soft. Bowel sounds are normal. She exhibits no distension. There is no tenderness.  Musculoskeletal: Normal range of motion. She exhibits no edema and no tenderness.  Lymphadenopathy:    She has no cervical adenopathy.  Neurological: She is alert and oriented to person, place, and time. Coordination normal.  Skin: Skin is warm and dry. No rash noted. No erythema.  Psychiatric: She has a normal mood and affect. Her behavior is normal. Judgment and thought content normal.         Assessment and Plan

## 2011-06-26 ENCOUNTER — Ambulatory Visit: Payer: Medicare Other

## 2011-07-05 ENCOUNTER — Ambulatory Visit (INDEPENDENT_AMBULATORY_CARE_PROVIDER_SITE_OTHER): Payer: Medicare Other | Admitting: Internal Medicine

## 2011-07-05 DIAGNOSIS — Z5181 Encounter for therapeutic drug level monitoring: Secondary | ICD-10-CM

## 2011-07-05 DIAGNOSIS — Z7901 Long term (current) use of anticoagulants: Secondary | ICD-10-CM

## 2011-07-05 DIAGNOSIS — I4891 Unspecified atrial fibrillation: Secondary | ICD-10-CM

## 2011-07-05 LAB — POCT INR: INR: 2.4

## 2011-07-05 NOTE — Patient Instructions (Signed)
Continue current dose, check in 4 weeks  

## 2011-07-31 ENCOUNTER — Other Ambulatory Visit: Payer: Self-pay | Admitting: *Deleted

## 2011-07-31 MED ORDER — WARFARIN SODIUM 5 MG PO TABS: 5.0000 mg | ORAL_TABLET | Freq: Every day | ORAL | Status: DC

## 2011-08-02 ENCOUNTER — Ambulatory Visit (INDEPENDENT_AMBULATORY_CARE_PROVIDER_SITE_OTHER): Payer: Medicare Other | Admitting: Internal Medicine

## 2011-08-02 DIAGNOSIS — I4891 Unspecified atrial fibrillation: Secondary | ICD-10-CM

## 2011-08-02 DIAGNOSIS — Z5181 Encounter for therapeutic drug level monitoring: Secondary | ICD-10-CM

## 2011-08-02 DIAGNOSIS — Z7901 Long term (current) use of anticoagulants: Secondary | ICD-10-CM

## 2011-08-02 NOTE — Patient Instructions (Signed)
contiune 5 mg daily, recheck 4 weeks 

## 2011-08-13 ENCOUNTER — Other Ambulatory Visit: Payer: Self-pay | Admitting: *Deleted

## 2011-08-13 MED ORDER — TRAMADOL HCL 50 MG PO TABS: 50.0000 mg | ORAL_TABLET | Freq: Four times a day (QID) | ORAL | Status: DC | PRN

## 2011-08-13 NOTE — Telephone Encounter (Signed)
rx sent to pharmacy by e-script Pt states she's suppose to follow-up in , will call for appt

## 2011-08-13 NOTE — Telephone Encounter (Signed)
Okay #60 x 1 Should set up follow up with me in the next couple of months

## 2011-08-30 ENCOUNTER — Ambulatory Visit (INDEPENDENT_AMBULATORY_CARE_PROVIDER_SITE_OTHER): Payer: Medicare Other | Admitting: Internal Medicine

## 2011-08-30 ENCOUNTER — Ambulatory Visit: Payer: Medicare Other

## 2011-08-30 DIAGNOSIS — Z5181 Encounter for therapeutic drug level monitoring: Secondary | ICD-10-CM

## 2011-08-30 DIAGNOSIS — Z7901 Long term (current) use of anticoagulants: Secondary | ICD-10-CM

## 2011-08-30 DIAGNOSIS — I4891 Unspecified atrial fibrillation: Secondary | ICD-10-CM

## 2011-08-30 LAB — POCT INR: INR: 1.8

## 2011-08-30 NOTE — Patient Instructions (Signed)
contiune 5 mg daily, 7.5 mg every Fri recheck 2 weeks

## 2011-09-16 ENCOUNTER — Ambulatory Visit (INDEPENDENT_AMBULATORY_CARE_PROVIDER_SITE_OTHER): Payer: Medicare Other | Admitting: Internal Medicine

## 2011-09-16 DIAGNOSIS — I4891 Unspecified atrial fibrillation: Secondary | ICD-10-CM

## 2011-09-16 DIAGNOSIS — Z7901 Long term (current) use of anticoagulants: Secondary | ICD-10-CM

## 2011-09-16 DIAGNOSIS — Z5181 Encounter for therapeutic drug level monitoring: Secondary | ICD-10-CM

## 2011-09-16 LAB — POCT INR: INR: 2.1

## 2011-09-16 NOTE — Patient Instructions (Signed)
Continue current dose, check in 4 weeks  

## 2011-09-24 ENCOUNTER — Other Ambulatory Visit: Payer: Self-pay | Admitting: *Deleted

## 2011-09-24 MED ORDER — OMEPRAZOLE 20 MG PO CPDR: 20.0000 mg | DELAYED_RELEASE_CAPSULE | Freq: Two times a day (BID) | ORAL | Status: DC

## 2011-10-08 ENCOUNTER — Other Ambulatory Visit: Payer: Self-pay | Admitting: *Deleted

## 2011-10-08 MED ORDER — AMIODARONE HCL 200 MG PO TABS: 200.0000 mg | ORAL_TABLET | Freq: Every day | ORAL | Status: DC

## 2011-10-14 ENCOUNTER — Ambulatory Visit (INDEPENDENT_AMBULATORY_CARE_PROVIDER_SITE_OTHER): Payer: Medicare Other | Admitting: Family Medicine

## 2011-10-14 DIAGNOSIS — Z5181 Encounter for therapeutic drug level monitoring: Secondary | ICD-10-CM

## 2011-10-14 DIAGNOSIS — I4891 Unspecified atrial fibrillation: Secondary | ICD-10-CM

## 2011-10-14 DIAGNOSIS — Z7901 Long term (current) use of anticoagulants: Secondary | ICD-10-CM

## 2011-10-14 NOTE — Patient Instructions (Signed)
contiune 5 mg daily, 7.5 mg every Fri recheck 4 weeks 

## 2011-11-11 ENCOUNTER — Ambulatory Visit (INDEPENDENT_AMBULATORY_CARE_PROVIDER_SITE_OTHER): Payer: Medicare Other | Admitting: Internal Medicine

## 2011-11-11 DIAGNOSIS — Z5181 Encounter for therapeutic drug level monitoring: Secondary | ICD-10-CM

## 2011-11-11 DIAGNOSIS — Z7901 Long term (current) use of anticoagulants: Secondary | ICD-10-CM

## 2011-11-11 DIAGNOSIS — I4891 Unspecified atrial fibrillation: Secondary | ICD-10-CM

## 2011-11-11 LAB — POCT INR: INR: 2

## 2011-11-11 NOTE — Patient Instructions (Signed)
Continue current dose, check in 4 weeks  

## 2011-11-19 ENCOUNTER — Other Ambulatory Visit: Payer: Self-pay | Admitting: *Deleted

## 2011-11-19 MED ORDER — POTASSIUM CHLORIDE ER 10 MEQ PO TBCR: 10.0000 meq | EXTENDED_RELEASE_TABLET | Freq: Two times a day (BID) | ORAL | Status: DC

## 2011-11-19 NOTE — Telephone Encounter (Signed)
Refilled

## 2011-11-25 ENCOUNTER — Other Ambulatory Visit: Payer: Self-pay | Admitting: *Deleted

## 2011-11-25 MED ORDER — TRAMADOL HCL 50 MG PO TABS: 50.0000 mg | ORAL_TABLET | Freq: Four times a day (QID) | ORAL | Status: DC | PRN

## 2011-12-09 ENCOUNTER — Ambulatory Visit: Payer: Medicare Other

## 2011-12-10 ENCOUNTER — Ambulatory Visit (INDEPENDENT_AMBULATORY_CARE_PROVIDER_SITE_OTHER): Payer: Medicare Other | Admitting: Internal Medicine

## 2011-12-10 DIAGNOSIS — Z7901 Long term (current) use of anticoagulants: Secondary | ICD-10-CM

## 2011-12-10 DIAGNOSIS — Z5181 Encounter for therapeutic drug level monitoring: Secondary | ICD-10-CM

## 2011-12-10 DIAGNOSIS — I4891 Unspecified atrial fibrillation: Secondary | ICD-10-CM

## 2011-12-10 NOTE — Patient Instructions (Signed)
Continue current dose, check in 4 weeks  

## 2012-01-06 ENCOUNTER — Ambulatory Visit (INDEPENDENT_AMBULATORY_CARE_PROVIDER_SITE_OTHER): Payer: Medicare Other | Admitting: Internal Medicine

## 2012-01-06 DIAGNOSIS — Z5181 Encounter for therapeutic drug level monitoring: Secondary | ICD-10-CM

## 2012-01-06 DIAGNOSIS — I4891 Unspecified atrial fibrillation: Secondary | ICD-10-CM

## 2012-01-06 DIAGNOSIS — Z7901 Long term (current) use of anticoagulants: Secondary | ICD-10-CM

## 2012-01-06 NOTE — Patient Instructions (Signed)
contiune 5 mg daily, 7.5 mg every Fri recheck 4 weeks 

## 2012-01-14 ENCOUNTER — Ambulatory Visit: Payer: Medicare Other | Admitting: Cardiovascular Disease

## 2012-01-20 ENCOUNTER — Encounter: Payer: Self-pay | Admitting: Cardiovascular Disease

## 2012-01-20 ENCOUNTER — Ambulatory Visit (INDEPENDENT_AMBULATORY_CARE_PROVIDER_SITE_OTHER): Payer: Medicare Other | Admitting: Cardiovascular Disease

## 2012-01-20 VITALS — BP 138/78 | HR 56 | Ht 68.0 in | Wt 210.0 lb

## 2012-01-20 DIAGNOSIS — I5022 Chronic systolic (congestive) heart failure: Secondary | ICD-10-CM

## 2012-01-20 DIAGNOSIS — I2510 Atherosclerotic heart disease of native coronary artery without angina pectoris: Secondary | ICD-10-CM

## 2012-01-20 DIAGNOSIS — I4891 Unspecified atrial fibrillation: Secondary | ICD-10-CM

## 2012-01-20 DIAGNOSIS — I10 Essential (primary) hypertension: Secondary | ICD-10-CM

## 2012-01-20 DIAGNOSIS — E785 Hyperlipidemia, unspecified: Secondary | ICD-10-CM

## 2012-01-20 DIAGNOSIS — I5023 Acute on chronic systolic (congestive) heart failure: Secondary | ICD-10-CM

## 2012-01-20 MED ORDER — METOPROLOL TARTRATE 25 MG PO TABS: 25.0000 mg | ORAL_TABLET | Freq: Two times a day (BID) | ORAL | Status: DC

## 2012-01-20 NOTE — Assessment & Plan Note (Signed)
Currently with no symptoms of angina. No further workup at this time. Continue current medication regimen. 

## 2012-01-20 NOTE — Patient Instructions (Addendum)
You are doing well. Decrease the metoprolol to 25 mg twice a day  If your blood pressure runs low, hold the lisinopril  Please call us if you have new issues that need to be addressed before your next appt.  Your physician wants you to follow-up in: 6 months.  You will receive a reminder letter in the mail two months in advance. If you don't receive a letter, please call our office to schedule the follow-up appointment.

## 2012-01-20 NOTE — Progress Notes (Signed)
Patient ID: Deborah Conway, female    DOB: [birth year], 78 y.o.   MRN: [id number]  HPI Comments: 78 year old woman with a history of coronary artery disease, PCI in the past with catheterization in May 2009 showing stable disease, paroxysmal atrial fibrillation, severe pulmonary hypertension by echocardiography done at [facility], long smoking history, not on inhalers, chronic shortness of breath, who presents for routine visit. She is a patient of Dr. Alphonsus Sias.  She reports that she has had several "blackout spells". Because of these and measurements at home showing low blood pressure, she cut down on some of her medications. She cut her amiodarone in half and takes 100 mg daily, decreased her metoprolol and takes 50 mg in the morning. She stopped isosorbide. With these changes, she reports feeling better and blood pressure typically runs in the 130 range. She denies any tachycardia or palpitations concerning for atrial fibrillation.  Overall she has no complaints apart from her arthritis  Echocardiogram from November 2011 shows mild aortic valve regurgitation, ejection fraction 35-40%.  EKG last visit shows normal sinus rhythm with Rate of 56 beats per minute, poor R wave progression through the anterior precordial leads, borderline intraventricular conduction delay   Outpatient Encounter Prescriptions as of 01/20/2012  Medication Sig Dispense Refill  . amiodarone (PACERONE) 200 MG tablet Take 1 tablet (1 cysts 00 mg total) by mouth daily.  30 tablet  6  . aspirin 81 MG tablet Take 1 tablet (81 mg total) by mouth daily.  30 tablet  6  . fish oil-omega-3 fatty acids 1000 MG capsule Take 2 g by mouth daily.        . furosemide (LASIX) 40 MG tablet Take 1 tablet (40 mg total) by mouth 2 (two) times daily. If gain more than 2lbs in 3 days or 3lbs in 5 days take an extra dose  60 tablet  6  . isosorbide mononitrate (IMDUR) 30 MG 24 hr tablet Take 30 mg by mouth as needed. NOT TAKING       .  lisinopril (PRINIVIL,ZESTRIL) 5 MG tablet Take 5 mg by mouth daily.        . metoprolol (LOPRESSOR) 50 MG tablet Take 50 mg by mouth daily.      . nitroGLYCERIN (NITROSTAT) 0.4 MG SL tablet Place 0.4 mg under the tongue every 5 (five) minutes as needed.        Marland Kitchen omeprazole (PRILOSEC) 20 MG capsule Take 1 capsule (20 mg total) by mouth 2 (two) times daily.  60 capsule  11  . traMADol (ULTRAM) 50 MG tablet Take 1 tablet (50 mg total) by mouth every 6 (six) hours as needed.  60 tablet  1  . warfarin (COUMADIN) 1 MG tablet Take 1 mg by mouth as directed. Takes 1.5 on Fridays.      Marland Kitchen warfarin (COUMADIN) 5 MG tablet Take 1 tablet (5 mg total) by mouth daily.  60 tablet  6  . potassium chloride (K-DUR) 10 MEQ tablet Take 10 mEq by mouth.       Review of Systems  Constitutional: Negative.   HENT: Negative.   Eyes: Negative.   Respiratory: Negative.   Cardiovascular: Negative.   Gastrointestinal: Negative.   Musculoskeletal: Positive for back pain and gait problem.  Skin: Negative.   Neurological: Negative.        "Blackout spells resolved "  Hematological: Negative.   Psychiatric/Behavioral: Negative.   All other systems reviewed and are negative.    BP 138/78  Ht  5\' 8"  (1.727 m)  Wt 210 lb (95.255 kg)  BMI 31.93 kg/m2  Physical Exam  Nursing note and vitals reviewed. Constitutional: She is oriented to person, place, and time. She appears well-developed and well-nourished.  HENT:  Head: Normocephalic.  Nose: Nose normal.  Mouth/Throat: Oropharynx is clear and moist.  Eyes: Conjunctivae are normal. Pupils are equal, round, and reactive to light.  Neck: Normal range of motion. Neck supple. No JVD present.  Cardiovascular: Normal rate, regular rhythm, S1 normal, S2 normal and intact distal pulses.  Exam reveals no gallop and no friction rub.   Murmur heard.  Crescendo systolic murmur is present with a grade of 2/6  Pulmonary/Chest: Effort normal and breath sounds normal. No  respiratory distress. She has no wheezes. She has no rales. She exhibits no tenderness.  Abdominal: Soft. Bowel sounds are normal. She exhibits no distension. There is no tenderness.  Musculoskeletal: Normal range of motion. She exhibits no edema and no tenderness.  Lymphadenopathy:    She has no cervical adenopathy.  Neurological: She is alert and oriented to person, place, and time. Coordination normal.  Skin: Skin is warm and dry. No rash noted. No erythema.  Psychiatric: She has a normal mood and affect. Her behavior is normal. Judgment and thought content normal.         Assessment and Plan

## 2012-01-20 NOTE — Assessment & Plan Note (Signed)
Maintaining NSR. She decreased her metoprolol and amiodarone. We have suggested that she take metoprolol 25 mg twice a day with amiodarone 200 mg daily.

## 2012-01-20 NOTE — Assessment & Plan Note (Signed)
She appears to be relatively euvolemic on today's visit with no symptoms of shortness of breath or edema.

## 2012-01-20 NOTE — Assessment & Plan Note (Signed)
No recent lipid panel for evaluation.

## 2012-01-20 NOTE — Assessment & Plan Note (Signed)
We have suggested she continue to hold her isosorbide, decrease metoprolol to 25 mg twice a day and call the office if she has any more near syncope.

## 2012-02-04 ENCOUNTER — Ambulatory Visit (INDEPENDENT_AMBULATORY_CARE_PROVIDER_SITE_OTHER): Payer: Medicare Other | Admitting: Internal Medicine

## 2012-02-04 ENCOUNTER — Ambulatory Visit: Payer: Medicare Other

## 2012-02-04 DIAGNOSIS — Z7901 Long term (current) use of anticoagulants: Secondary | ICD-10-CM

## 2012-02-04 DIAGNOSIS — I4891 Unspecified atrial fibrillation: Secondary | ICD-10-CM

## 2012-02-04 DIAGNOSIS — Z5181 Encounter for therapeutic drug level monitoring: Secondary | ICD-10-CM

## 2012-02-04 LAB — POCT INR: INR: 2.2

## 2012-03-03 ENCOUNTER — Ambulatory Visit (INDEPENDENT_AMBULATORY_CARE_PROVIDER_SITE_OTHER): Payer: Medicare Other | Admitting: Internal Medicine

## 2012-03-03 ENCOUNTER — Encounter: Payer: Self-pay | Admitting: Internal Medicine

## 2012-03-03 VITALS — BP 126/64 | HR 60 | Temp 97.8°F | Ht 68.0 in | Wt 209.5 lb

## 2012-03-03 DIAGNOSIS — M1710 Unilateral primary osteoarthritis, unspecified knee: Secondary | ICD-10-CM

## 2012-03-03 DIAGNOSIS — R251 Tremor, unspecified: Secondary | ICD-10-CM | POA: Insufficient documentation

## 2012-03-03 DIAGNOSIS — I2510 Atherosclerotic heart disease of native coronary artery without angina pectoris: Secondary | ICD-10-CM

## 2012-03-03 DIAGNOSIS — I4891 Unspecified atrial fibrillation: Secondary | ICD-10-CM

## 2012-03-03 DIAGNOSIS — Z5181 Encounter for therapeutic drug level monitoring: Secondary | ICD-10-CM

## 2012-03-03 DIAGNOSIS — R259 Unspecified abnormal involuntary movements: Secondary | ICD-10-CM

## 2012-03-03 DIAGNOSIS — I5022 Chronic systolic (congestive) heart failure: Secondary | ICD-10-CM

## 2012-03-03 DIAGNOSIS — Z7901 Long term (current) use of anticoagulants: Secondary | ICD-10-CM

## 2012-03-03 LAB — HEPATIC FUNCTION PANEL
AST: 17 U/L (ref 0–37)
Total Bilirubin: 0.6 mg/dL (ref 0.3–1.2)

## 2012-03-03 LAB — CBC WITH DIFFERENTIAL/PLATELET
Basophils Absolute: 0 10*3/uL (ref 0.0–0.1)
Eosinophils Relative: 1.5 % (ref 0.0–5.0)
MCV: 89.8 fl (ref 78.0–100.0)
Monocytes Absolute: 0.4 10*3/uL (ref 0.1–1.0)
Monocytes Relative: 9 % (ref 3.0–12.0)
Neutrophils Relative %: 58.1 % (ref 43.0–77.0)
Platelets: 114 10*3/uL — ABNORMAL LOW (ref 150.0–400.0)
RDW: 16.2 % — ABNORMAL HIGH (ref 11.5–14.6)
WBC: 4.2 10*3/uL — ABNORMAL LOW (ref 4.5–10.5)

## 2012-03-03 LAB — BASIC METABOLIC PANEL
CO2: 32 mEq/L (ref 19–32)
GFR: 48.28 mL/min — ABNORMAL LOW (ref >60.00)
Glucose, Bld: 92 mg/dL (ref 70–99)
Potassium: 4.2 mEq/L (ref 3.5–5.1)
Sodium: 140 mEq/L (ref 135–145)

## 2012-03-03 LAB — TSH: TSH: 5.8 u[IU]/mL — ABNORMAL HIGH (ref 0.35–5.50)

## 2012-03-03 LAB — POCT INR: INR: 2.4

## 2012-03-03 MED ORDER — TRAMADOL HCL 50 MG PO TABS: 50.0000 mg | ORAL_TABLET | Freq: Four times a day (QID) | ORAL | Status: DC | PRN

## 2012-03-03 MED ORDER — HYDROCODONE-ACETAMINOPHEN 5-325 MG PO TABS: 1.0000 | ORAL_TABLET | Freq: Every evening | ORAL | Status: AC | PRN

## 2012-03-03 NOTE — Patient Instructions (Signed)
contiune 5 mg daily, 7.5 mg every Fri recheck 4 weeks

## 2012-03-03 NOTE — Assessment & Plan Note (Signed)
Seems to be euvolemic No changes needed

## 2012-03-03 NOTE — Assessment & Plan Note (Signed)
Doesn't seem to have any other symptoms of Parkinsons Will observe only Check thyroid

## 2012-03-03 NOTE — Assessment & Plan Note (Signed)
Worse Will add hydrocodone for bedtime Consider cortisone shot

## 2012-03-03 NOTE — Progress Notes (Signed)
Subjective:    Patient ID: Deborah Conway, female    DOB: [birth year], 78 y.o.   MRN: [id number]  HPI Here with granddaughter [name] Reviewed Dr Windell Hummingbird notes No dizziness now since her med adjustments have been made BP doesn't seem to be so low now  She has been using the isosorbide only rarely Not much angina---occ feeling in chest Discussed using the NTG under her tongue only. Would consider the isosorbide again if she needs freq NTG  Gets intermittent SOB Feels tired when she takes a big breath  Now having shakes for 2-3 months Able to stop it if she holds it Mostly in left hand--can be bad at times No change in tasks---uses right hand mostly  Having bad arthritis pain Tramadol doesn't really help that much--especially at night Did get better relief from percocet in the past  Current Outpatient Prescriptions on File Prior to Visit  Medication Sig Dispense Refill  . amiodarone (PACERONE) 200 MG tablet Take 1 tablet (200 mg total) by mouth daily.  30 tablet  6  . aspirin 81 MG tablet Take 1 tablet (81 mg total) by mouth daily.  30 tablet  6  . fish oil-omega-3 fatty acids 1000 MG capsule Take 2 g by mouth daily.        . furosemide (LASIX) 40 MG tablet Take 1 tablet (40 mg total) by mouth 2 (two) times daily. If gain more than 2lbs in 3 days or 3lbs in 5 days take an extra dose  60 tablet  6  . lisinopril (PRINIVIL,ZESTRIL) 5 MG tablet Take 5 mg by mouth daily.        . metoprolol (LOPRESSOR) 25 MG tablet Take 1 tablet (25 mg total) by mouth 2 (two) times daily.  180 tablet  3  . nitroGLYCERIN (NITROSTAT) 0.4 MG SL tablet Place 0.4 mg under the tongue every 5 (five) minutes as needed.        Marland Kitchen omeprazole (PRILOSEC) 20 MG capsule Take 1 capsule (20 mg total) by mouth 2 (two) times daily.  60 capsule  11  . traMADol (ULTRAM) 50 MG tablet Take 1 tablet (50 mg total) by mouth every 6 (six) hours as needed.  60 tablet  1  . warfarin (COUMADIN) 1 MG tablet Take 1 mg by mouth as  directed. Takes 1.5 on Fridays.      Marland Kitchen warfarin (COUMADIN) 5 MG tablet Take 1 tablet (5 mg total) by mouth daily.  60 tablet  6    Allergies  Allergen Reactions  . Ezetimibe     REACTION: sever myalgias  . Ibuprofen   . Rosuvastatin     REACTION: leg cramps  . Simvastatin     REACTION: leg cramps    Past Medical History  Diagnosis Date  . History of colon cancer   . CAD (coronary artery disease)   . GERD (gastroesophageal reflux disease)   . Hyperlipidemia   . Hypertension   . Rheumatoid arthritis   . Osteoarthritis   . Atrial fibrillation   . Stroke   . Pulmonary hypertension   . CHF (congestive heart failure)   . Chest pain     5/09 Chest pain--cath at [facility] shows patent stent and no sig obstructive disease  . SOB (shortness of breath)     Admitted with SOB 11/10-------------severe pulm HTN found on echo    Past Surgical History  Procedure Date  . Appendectomy   . Tonsillectomy   . Carotid stent   . Partial  colectomy 1989    colon  . Vaginal delivery     x1  . Cataract extraction 2007    left  . Total knee arthroplasty 11/09    right  . Ablation of dysrhythmic focus 11/11    Ablation for atrial flutter  . Cardiac catheterization     Cath severe MR good LV EF, mild CAD 05/2005    Family History  Problem Relation Age of Onset  . Heart disease Mother   . Heart disease Father   . Diabetes Son   . Cancer Paternal Aunt     colon cancer  . Cancer Brother     lung cancer    History   Social History  . Marital Status: Widowed    Spouse Name: N/A    Number of Children: 1  . Years of Education: N/A   Occupational History  . retiredTEFL teacher    Social History Main Topics  . Smoking status: Never Smoker   . Smokeless tobacco: Never Used  . Alcohol Use: No  . Drug Use: No  . Sexually Active: Not on file   Other Topics Concern  . Not on file   Social History Narrative   Son Ree Kida has health care POA. WOuld accept resuscitation but doesn't want  any prolonged artifical respiration or machines   Review of Systems Sleeps okay---fairly flat (limited mostly by pain esp in back) Appetite is not great Weight is stable    Objective:   Physical Exam  Constitutional: She appears well-developed and well-nourished. No distress.  Neck: Normal range of motion. Neck supple. No thyromegaly present.  Cardiovascular: Normal rate, regular rhythm and normal heart sounds.  Exam reveals no gallop.   No murmur heard. Pulmonary/Chest: Effort normal and breath sounds normal. No respiratory distress. She has no wheezes. She has no rales.  Musculoskeletal:       Marked thickening in left knee  Lymphadenopathy:    She has no cervical adenopathy.  Neurological:       Very fine resting tremor in left hand---not increased with attention Normal tone No sig bradykinesia  Skin: No rash noted. No erythema.  Psychiatric: She has a normal mood and affect. Her behavior is normal.          Assessment & Plan:

## 2012-03-03 NOTE — Assessment & Plan Note (Signed)
Seems to have stable angina pattern Will have her stop her occ isosorbide Will use NTG under tongue prn

## 2012-03-03 NOTE — Patient Instructions (Signed)
Please continue tramadol for pain during the day. Try the hydrocodone for pain at night Call for appt in the next few weeks if you want a cortisone shot in your knee

## 2012-03-03 NOTE — Assessment & Plan Note (Signed)
Regular on amiodarone On coumadin

## 2012-04-02 ENCOUNTER — Ambulatory Visit (INDEPENDENT_AMBULATORY_CARE_PROVIDER_SITE_OTHER): Payer: Medicare Other | Admitting: Internal Medicine

## 2012-04-02 DIAGNOSIS — Z5181 Encounter for therapeutic drug level monitoring: Secondary | ICD-10-CM

## 2012-04-02 DIAGNOSIS — Z7901 Long term (current) use of anticoagulants: Secondary | ICD-10-CM

## 2012-04-02 DIAGNOSIS — I4891 Unspecified atrial fibrillation: Secondary | ICD-10-CM

## 2012-04-02 LAB — POCT INR: INR: 2.4

## 2012-04-02 NOTE — Patient Instructions (Addendum)
Continue 5 mg daily, except 7.5 mg Fri, recheck 4 weeks

## 2012-04-21 ENCOUNTER — Other Ambulatory Visit: Payer: Self-pay

## 2012-04-21 ENCOUNTER — Ambulatory Visit (INDEPENDENT_AMBULATORY_CARE_PROVIDER_SITE_OTHER): Payer: Medicare Other | Admitting: Internal Medicine

## 2012-04-21 DIAGNOSIS — Z5181 Encounter for therapeutic drug level monitoring: Secondary | ICD-10-CM

## 2012-04-21 DIAGNOSIS — I4891 Unspecified atrial fibrillation: Secondary | ICD-10-CM

## 2012-04-21 DIAGNOSIS — Z7901 Long term (current) use of anticoagulants: Secondary | ICD-10-CM

## 2012-04-21 MED ORDER — FUROSEMIDE 40 MG PO TABS: 40.0000 mg | ORAL_TABLET | Freq: Two times a day (BID) | ORAL | Status: DC

## 2012-04-21 NOTE — Telephone Encounter (Signed)
Refill sent for lasix 40 mg take one tablet by mouth twice a day.

## 2012-04-21 NOTE — Patient Instructions (Addendum)
Hold today's dose. Continue 5 mg daily. Return in 2 weeks for recheck.

## 2012-04-27 ENCOUNTER — Other Ambulatory Visit: Payer: Self-pay | Admitting: *Deleted

## 2012-04-27 NOTE — Telephone Encounter (Signed)
Last filled 03/03/12 

## 2012-04-28 ENCOUNTER — Inpatient Hospital Stay: Payer: Self-pay | Admitting: Internal Medicine

## 2012-04-28 DIAGNOSIS — R0602 Shortness of breath: Secondary | ICD-10-CM

## 2012-04-28 DIAGNOSIS — R079 Chest pain, unspecified: Secondary | ICD-10-CM

## 2012-04-28 LAB — CBC
HCT: 34.7 % — ABNORMAL LOW (ref 35.0–47.0)
HGB: 11.3 g/dL — ABNORMAL LOW (ref 12.0–16.0)
MCH: 28.9 pg (ref 26.0–34.0)
MCHC: 32.6 g/dL (ref 32.0–36.0)
MCV: 89 fL (ref 80–100)
RBC: 3.91 10*6/uL (ref 3.80–5.20)

## 2012-04-28 LAB — COMPREHENSIVE METABOLIC PANEL
Anion Gap: 7 (ref 7–16)
BUN: 19 mg/dL — ABNORMAL HIGH (ref 7–18)
Calcium, Total: 8 mg/dL — ABNORMAL LOW (ref 8.5–10.1)
Chloride: 104 mmol/L (ref 98–107)
Co2: 30 mmol/L (ref 21–32)
EGFR (African American): 54 — ABNORMAL LOW
EGFR (Non-African Amer.): 46 — ABNORMAL LOW
Glucose: 118 mg/dL — ABNORMAL HIGH (ref 65–99)
Potassium: 3.5 mmol/L (ref 3.5–5.1)
SGOT(AST): 36 U/L (ref 15–37)
Total Protein: 6.7 g/dL (ref 6.4–8.2)

## 2012-04-28 LAB — CK TOTAL AND CKMB (NOT AT ARMC)
CK, Total: 37 U/L (ref 21–215)
CK, Total: 34 U/L (ref 21–215)
CK-MB: 0.9 ng/mL (ref 0.5–3.6)
CK-MB: 0.9 ng/mL (ref 0.5–3.6)

## 2012-04-28 LAB — PRO B NATRIURETIC PEPTIDE: B-Type Natriuretic Peptide: 2267 pg/mL — ABNORMAL HIGH (ref 0–450)

## 2012-04-28 LAB — TROPONIN I
Troponin-I: <0.02 ng/mL
Troponin-I: <0.02 ng/mL

## 2012-04-28 LAB — PROTIME-INR
INR: 3
Prothrombin Time: 31.1 secs — ABNORMAL HIGH (ref 11.5–14.7)

## 2012-04-28 MED ORDER — HYDROCODONE-ACETAMINOPHEN 5-325 MG PO TABS: 1.0000 | ORAL_TABLET | Freq: Every evening | ORAL | Status: DC | PRN

## 2012-04-28 NOTE — Telephone Encounter (Signed)
rx called into pharmacy

## 2012-04-28 NOTE — Telephone Encounter (Signed)
Okay #30 x 0 

## 2012-04-29 DIAGNOSIS — I059 Rheumatic mitral valve disease, unspecified: Secondary | ICD-10-CM

## 2012-04-29 DIAGNOSIS — I5021 Acute systolic (congestive) heart failure: Secondary | ICD-10-CM

## 2012-04-29 LAB — CBC WITH DIFFERENTIAL/PLATELET
Basophil #: 0 10*3/uL (ref 0.0–0.1)
Eosinophil %: 1.4 %
HCT: 30.9 % — ABNORMAL LOW (ref 35.0–47.0)
HGB: 10.3 g/dL — ABNORMAL LOW (ref 12.0–16.0)
MCH: 29.6 pg (ref 26.0–34.0)
Monocyte #: 0.5 x10 3/mm (ref 0.2–0.9)
Neutrophil %: 62.5 %
Platelet: 105 10*3/uL — ABNORMAL LOW (ref 150–440)
RDW: 16.1 % — ABNORMAL HIGH (ref 11.5–14.5)
WBC: 5 10*3/uL (ref 3.6–11.0)

## 2012-04-29 LAB — BASIC METABOLIC PANEL
Anion Gap: 7 (ref 7–16)
BUN: 22 mg/dL — ABNORMAL HIGH (ref 7–18)
Calcium, Total: 8.7 mg/dL (ref 8.5–10.1)
Creatinine: 1.19 mg/dL (ref 0.60–1.30)
EGFR (African American): 47 — ABNORMAL LOW
EGFR (Non-African Amer.): 41 — ABNORMAL LOW
Glucose: 104 mg/dL — ABNORMAL HIGH (ref 65–99)
Osmolality: 285 (ref 275–301)
Potassium: 3.7 mmol/L (ref 3.5–5.1)

## 2012-04-29 LAB — LIPID PANEL
Cholesterol: 144 mg/dL (ref 0–200)
Ldl Cholesterol, Calc: 88 mg/dL (ref 0–100)
VLDL Cholesterol, Calc: 14 mg/dL (ref 5–40)

## 2012-04-29 LAB — TROPONIN I: Troponin-I: <0.02 ng/mL

## 2012-04-29 LAB — TSH: Thyroid Stimulating Horm: 5.04 u[IU]/mL — ABNORMAL HIGH

## 2012-04-30 LAB — BASIC METABOLIC PANEL
BUN: 20 mg/dL — ABNORMAL HIGH (ref 7–18)
Calcium, Total: 8.6 mg/dL (ref 8.5–10.1)
EGFR (African American): 58 — ABNORMAL LOW
EGFR (Non-African Amer.): 50 — ABNORMAL LOW
Glucose: 113 mg/dL — ABNORMAL HIGH (ref 65–99)
Sodium: 141 mmol/L (ref 136–145)

## 2012-04-30 LAB — PROTIME-INR: INR: 3.1

## 2012-05-01 LAB — URINALYSIS, COMPLETE
Blood: NEGATIVE
Hyaline Cast: 4
Ketone: NEGATIVE
Ph: 6 (ref 4.5–8.0)
Squamous Epithelial: 1
WBC UR: 5 /HPF (ref 0–5)

## 2012-05-01 LAB — PROTIME-INR: Prothrombin Time: 31.3 secs — ABNORMAL HIGH (ref 11.5–14.7)

## 2012-05-02 LAB — BASIC METABOLIC PANEL
Anion Gap: 7 (ref 7–16)
BUN: 18 mg/dL (ref 7–18)
Chloride: 98 mmol/L (ref 98–107)
Co2: 36 mmol/L — ABNORMAL HIGH (ref 21–32)
Creatinine: 1.07 mg/dL (ref 0.60–1.30)
EGFR (Non-African Amer.): 46 — ABNORMAL LOW
Glucose: 133 mg/dL — ABNORMAL HIGH (ref 65–99)
Sodium: 141 mmol/L (ref 136–145)

## 2012-05-03 LAB — CBC WITH DIFFERENTIAL/PLATELET
Basophil #: 0 10*3/uL (ref 0.0–0.1)
Basophil %: 0.4 %
Eosinophil #: 0.1 10*3/uL (ref 0.0–0.7)
HGB: 10.1 g/dL — ABNORMAL LOW (ref 12.0–16.0)
Lymphocyte #: 0.9 10*3/uL — ABNORMAL LOW (ref 1.0–3.6)
Lymphocyte %: 22.7 %
MCH: 29.6 pg (ref 26.0–34.0)
MCHC: 33.1 g/dL (ref 32.0–36.0)
Monocyte #: 0.4 x10 3/mm (ref 0.2–0.9)
Neutrophil %: 65.7 %
RDW: 16.3 % — ABNORMAL HIGH (ref 11.5–14.5)

## 2012-05-04 LAB — BASIC METABOLIC PANEL
Anion Gap: 7 (ref 7–16)
BUN: 15 mg/dL (ref 7–18)
Calcium, Total: 8.9 mg/dL (ref 8.5–10.1)
Co2: 38 mmol/L — ABNORMAL HIGH (ref 21–32)
EGFR (African American): 57 — ABNORMAL LOW
EGFR (Non-African Amer.): 49 — ABNORMAL LOW
Potassium: 4 mmol/L (ref 3.5–5.1)

## 2012-05-04 LAB — PROTIME-INR: INR: 1.9

## 2012-05-13 ENCOUNTER — Ambulatory Visit: Payer: Medicare Other | Admitting: Internal Medicine

## 2012-05-15 ENCOUNTER — Ambulatory Visit: Payer: Medicare Other | Admitting: Internal Medicine

## 2012-05-15 ENCOUNTER — Ambulatory Visit: Payer: Medicare Other

## 2012-05-19 ENCOUNTER — Ambulatory Visit: Payer: Medicare Other

## 2012-05-21 ENCOUNTER — Telehealth: Payer: Self-pay | Admitting: Internal Medicine

## 2012-05-21 NOTE — Telephone Encounter (Signed)
okay

## 2012-05-21 NOTE — Telephone Encounter (Signed)
Patient's son called to let you know he received your message.  He said patient is still at [facility] for rehab and will be there for at least another two weeks.

## 2012-05-22 ENCOUNTER — Ambulatory Visit: Payer: Medicare Other | Admitting: Internal Medicine

## 2012-06-08 ENCOUNTER — Inpatient Hospital Stay: Payer: Self-pay | Admitting: Internal Medicine

## 2012-06-08 ENCOUNTER — Telehealth: Payer: Self-pay | Admitting: Internal Medicine

## 2012-06-08 ENCOUNTER — Ambulatory Visit: Payer: Medicare Other | Admitting: Internal Medicine

## 2012-06-08 LAB — URINALYSIS, COMPLETE
Bilirubin,UR: NEGATIVE
Glucose,UR: NEGATIVE mg/dL (ref 0–75)
Hyaline Cast: 61
Ketone: NEGATIVE
Nitrite: NEGATIVE
Protein: NEGATIVE
RBC,UR: NONE SEEN /HPF (ref 0–5)
Specific Gravity: 1.012 (ref 1.003–1.030)
WBC UR: 1 /HPF (ref 0–5)

## 2012-06-08 LAB — BASIC METABOLIC PANEL
Calcium, Total: 9 mg/dL (ref 8.5–10.1)
Chloride: 77 mmol/L — ABNORMAL LOW (ref 98–107)
Co2: 42 mmol/L (ref 21–32)
EGFR (African American): 26 — ABNORMAL LOW
EGFR (Non-African Amer.): 23 — ABNORMAL LOW
Osmolality: 257 (ref 275–301)
Potassium: 3.2 mmol/L — ABNORMAL LOW (ref 3.5–5.1)
Sodium: 125 mmol/L — ABNORMAL LOW (ref 136–145)

## 2012-06-08 LAB — CBC
HCT: 35.5 % (ref 35.0–47.0)
HGB: 11.8 g/dL — ABNORMAL LOW (ref 12.0–16.0)
MCH: 28.6 pg (ref 26.0–34.0)
MCHC: 33.3 g/dL (ref 32.0–36.0)
MCV: 86 fL (ref 80–100)
RBC: 4.14 10*6/uL (ref 3.80–5.20)
RDW: 16.7 % — ABNORMAL HIGH (ref 11.5–14.5)

## 2012-06-08 LAB — TROPONIN I
Troponin-I: <0.02 ng/mL
Troponin-I: <0.02 ng/mL

## 2012-06-08 NOTE — Telephone Encounter (Signed)
Patient's son called to cancel patient's appointment for today.  He found patient on the floor unresponsive and she went to [facility] by ambulance.  Patient's son said he would call with updates.

## 2012-06-08 NOTE — Telephone Encounter (Signed)
Noted I will check on her at [facility]

## 2012-06-09 LAB — CBC WITH DIFFERENTIAL/PLATELET
Basophil %: 0.5 %
Eosinophil #: 0.1 10*3/uL (ref 0.0–0.7)
Eosinophil %: 0.9 %
HCT: 33.5 % — ABNORMAL LOW (ref 35.0–47.0)
HGB: 11.1 g/dL — ABNORMAL LOW (ref 12.0–16.0)
MCHC: 33 g/dL (ref 32.0–36.0)
MCV: 86 fL (ref 80–100)
Monocyte #: 0.8 x10 3/mm (ref 0.2–0.9)
Monocyte %: 12.4 %
RBC: 3.89 10*6/uL (ref 3.80–5.20)
RDW: 16.4 % — ABNORMAL HIGH (ref 11.5–14.5)
WBC: 6.5 10*3/uL (ref 3.6–11.0)

## 2012-06-09 LAB — BASIC METABOLIC PANEL
Anion Gap: 5 — ABNORMAL LOW (ref 7–16)
BUN: 36 mg/dL — ABNORMAL HIGH (ref 7–18)
Calcium, Total: 9 mg/dL (ref 8.5–10.1)
Co2: 42 mmol/L (ref 21–32)
Creatinine: 2.15 mg/dL — ABNORMAL HIGH (ref 0.60–1.30)
EGFR (Non-African Amer.): 20 — ABNORMAL LOW
Osmolality: 267 (ref 275–301)
Potassium: 3.5 mmol/L (ref 3.5–5.1)
Sodium: 129 mmol/L — ABNORMAL LOW (ref 136–145)

## 2012-06-10 LAB — BASIC METABOLIC PANEL
Anion Gap: 7 (ref 7–16)
BUN: 30 mg/dL — ABNORMAL HIGH (ref 7–18)
Calcium, Total: 9 mg/dL (ref 8.5–10.1)
Creatinine: 1.33 mg/dL — ABNORMAL HIGH (ref 0.60–1.30)
EGFR (Non-African Amer.): 36 — ABNORMAL LOW
Glucose: 113 mg/dL — ABNORMAL HIGH (ref 65–99)
Osmolality: 272 (ref 275–301)
Potassium: 4.1 mmol/L (ref 3.5–5.1)

## 2012-06-10 LAB — PROTIME-INR: INR: 1.5

## 2012-06-11 LAB — BASIC METABOLIC PANEL
Anion Gap: 6 — ABNORMAL LOW (ref 7–16)
Calcium, Total: 8.9 mg/dL (ref 8.5–10.1)
Chloride: 92 mmol/L — ABNORMAL LOW (ref 98–107)
Co2: 37 mmol/L — ABNORMAL HIGH (ref 21–32)
EGFR (African American): >60
EGFR (Non-African Amer.): 54 — ABNORMAL LOW
Osmolality: 272 (ref 275–301)
Potassium: 3.8 mmol/L (ref 3.5–5.1)
Sodium: 135 mmol/L — ABNORMAL LOW (ref 136–145)

## 2012-06-11 LAB — PROTIME-INR: INR: 1.6

## 2012-06-12 ENCOUNTER — Telehealth: Payer: Self-pay | Admitting: Internal Medicine

## 2012-06-12 ENCOUNTER — Other Ambulatory Visit: Payer: Self-pay

## 2012-06-12 MED ORDER — WARFARIN SODIUM 4 MG PO TABS: 4.0000 mg | ORAL_TABLET | Freq: Every day | ORAL | Status: DC

## 2012-06-12 MED ORDER — SUCRALFATE 1 G PO TABS: 1.0000 g | ORAL_TABLET | Freq: Three times a day (TID) | ORAL | Status: DC

## 2012-06-12 MED ORDER — METOLAZONE 2.5 MG PO TABS: 2.5000 mg | ORAL_TABLET | Freq: Every day | ORAL | Status: DC

## 2012-06-12 MED ORDER — ALBUTEROL SULFATE HFA 108 (90 BASE) MCG/ACT IN AERS: 2.0000 | INHALATION_SPRAY | Freq: Four times a day (QID) | RESPIRATORY_TRACT | Status: DC

## 2012-06-12 NOTE — Telephone Encounter (Signed)
Sheila/[facility] RN calling about interaction between two of her medications.  States she has azithromycin ordered, and takes amiodarone.  States has one more dose of azithromycin to take 06/13/12, and has noted a contraindication.  Wants to clarify that she should get the last dose or hold pacerone at this time.  May reach RN at [phone].  krs/can

## 2012-06-12 NOTE — Telephone Encounter (Signed)
I agree that the combination is not optimal Should probably avoid the azithromycin in the future I would have her hold the last dose and not take it

## 2012-06-12 NOTE — Telephone Encounter (Signed)
Left message for granddaughter that rx's where sent to the pharmacy

## 2012-06-12 NOTE — Telephone Encounter (Signed)
Okay to send the coumadin for 1 year  Sucralfate may not be long term---will discuss #90 x 3  proair should be 4 times per day prn #1 x 2 refills  zaroxlyn may not be every day but can take daily till appt if that is what they instructed at the hospital #30x 1

## 2012-06-12 NOTE — Telephone Encounter (Signed)
[name] request refill for Coumadin 4 mg taking one daily, Sucrafate 1 gm taking one before each meal, Metolazone 2.5 mg taking one daily and Proair one puff 4 times a day to [facility]. None of these meds are on med list. [name] said these are new meds since hospitalization.Please advise. Pt has 30 min f/u after hospital discharge on 06/23/12.Please advise.

## 2012-06-15 NOTE — Telephone Encounter (Signed)
Spoke with nurse Silvio Pate with [facility] and advised results

## 2012-06-16 ENCOUNTER — Ambulatory Visit: Payer: Medicare Other | Admitting: Internal Medicine

## 2012-06-19 ENCOUNTER — Ambulatory Visit: Payer: Medicare Other | Admitting: Internal Medicine

## 2012-06-23 ENCOUNTER — Encounter: Payer: Self-pay | Admitting: Internal Medicine

## 2012-06-23 ENCOUNTER — Ambulatory Visit (INDEPENDENT_AMBULATORY_CARE_PROVIDER_SITE_OTHER): Payer: Medicare Other | Admitting: Internal Medicine

## 2012-06-23 VITALS — BP 120/70 | HR 59 | Temp 97.6°F | Wt 198.0 lb

## 2012-06-23 DIAGNOSIS — I4891 Unspecified atrial fibrillation: Secondary | ICD-10-CM

## 2012-06-23 DIAGNOSIS — I2510 Atherosclerotic heart disease of native coronary artery without angina pectoris: Secondary | ICD-10-CM

## 2012-06-23 DIAGNOSIS — I5022 Chronic systolic (congestive) heart failure: Secondary | ICD-10-CM

## 2012-06-23 DIAGNOSIS — Z5181 Encounter for therapeutic drug level monitoring: Secondary | ICD-10-CM

## 2012-06-23 DIAGNOSIS — Z7901 Long term (current) use of anticoagulants: Secondary | ICD-10-CM

## 2012-06-23 DIAGNOSIS — R55 Syncope and collapse: Secondary | ICD-10-CM

## 2012-06-23 LAB — BASIC METABOLIC PANEL
BUN: 26 mg/dL — ABNORMAL HIGH (ref 6–23)
CO2: 37 mEq/L — ABNORMAL HIGH (ref 19–32)
Calcium: 8.6 mg/dL (ref 8.4–10.5)
GFR: 43.75 mL/min — ABNORMAL LOW (ref >60.00)
Glucose, Bld: 101 mg/dL — ABNORMAL HIGH (ref 70–99)

## 2012-06-23 NOTE — Patient Instructions (Signed)
Increase to 4 mg daily except 5 mg M/W/F Return in 2 weeks for recheck.

## 2012-06-23 NOTE — Progress Notes (Signed)
Subjective:    Patient ID: Deborah Conway, female    DOB: November 06, 1921, 78 y.o.   MRN: [id number]  HPI Here with granddaughter [name]  Had syncopal spell after being home only 2 days from rehab Initially hospitalized for CHF and bronchitis She had increased doses of diuretics  Had stomach virus in past 2 days Now feeling a little better now  No chest pain of note---1 spell 3 days ago only Now on isosorbide Breathing is better now---still some pain along left shoulderblade Uses the pain pills occasionally  Has aides twice a day Weigh her Help with meals, etc  Did jump up 5# one day Granddaughter had her double her lasix then  Current Outpatient Prescriptions on File Prior to Visit  Medication Sig Dispense Refill  . albuterol (PROVENTIL HFA;VENTOLIN HFA) 108 (90 BASE) MCG/ACT inhaler Inhale 2 puffs into the lungs 4 (four) times daily.  8.5 g  2  . amiodarone (PACERONE) 200 MG tablet Take 1 tablet (200 mg total) by mouth daily.  30 tablet  6  . aspirin 81 MG tablet Take 1 tablet (81 mg total) by mouth daily.  30 tablet  6  . fish oil-omega-3 fatty acids 1000 MG capsule Take 2 g by mouth daily.        Marland Kitchen HYDROcodone-acetaminophen (NORCO/VICODIN) 5-325 MG per tablet Take 1-2 tablets by mouth at bedtime as needed for pain.  30 tablet  0  . lisinopril (PRINIVIL,ZESTRIL) 5 MG tablet Take 5 mg by mouth daily.        . metolazone (ZAROXOLYN) 2.5 MG tablet Take 1 tablet (2.5 mg total) by mouth daily.  30 tablet  1  . nitroGLYCERIN (NITROSTAT) 0.4 MG SL tablet Place 0.4 mg under the tongue every 5 (five) minutes as needed.        Marland Kitchen omeprazole (PRILOSEC) 20 MG capsule Take 1 capsule (20 mg total) by mouth 2 (two) times daily.  60 capsule  11  . sucralfate (CARAFATE) 1 G tablet Take 1 tablet (1 g total) by mouth 3 (three) times daily before meals.  90 tablet  3  . traMADol (ULTRAM) 50 MG tablet Take 1 tablet (50 mg total) by mouth every 6 (six) hours as needed.  90 tablet  0  . warfarin  (COUMADIN) 4 MG tablet Take 1 tablet (4 mg total) by mouth daily.  90 tablet  3    Allergies  Allergen Reactions  . Ezetimibe     REACTION: sever myalgias  . Ibuprofen   . Rosuvastatin     REACTION: leg cramps  . Simvastatin     REACTION: leg cramps    Past Medical History  Diagnosis Date  . History of colon cancer   . CAD (coronary artery disease)   . GERD (gastroesophageal reflux disease)   . Hyperlipidemia   . Hypertension   . Rheumatoid arthritis   . Osteoarthritis   . Atrial fibrillation   . Stroke   . Pulmonary hypertension   . CHF (congestive heart failure)   . Chest pain     5/09 Chest pain--cath at [facility] shows patent stent and no sig obstructive disease  . SOB (shortness of breath)     Admitted with SOB 11/10-------------severe pulm HTN found on echo    Past Surgical History  Procedure Date  . Appendectomy   . Tonsillectomy   . Carotid stent   . Partial colectomy 1989    colon  . Vaginal delivery     x1  .  Cataract extraction 2007    left  . Total knee arthroplasty 11/09    right  . Ablation of dysrhythmic focus 11/11    Ablation for atrial flutter  . Cardiac catheterization     Cath severe MR good LV EF, mild CAD 05/2005    Family History  Problem Relation Age of Onset  . Heart disease Mother   . Heart disease Father   . Diabetes Son   . Cancer Paternal Aunt     colon cancer  . Cancer Brother     lung cancer    History   Social History  . Marital Status: Widowed    Spouse Name: N/A    Number of Children: 1  . Years of Education: N/A   Occupational History  . retiredTEFL teacher    Social History Main Topics  . Smoking status: Never Smoker   . Smokeless tobacco: Never Used  . Alcohol Use: No  . Drug Use: No  . Sexually Active: Not on file   Other Topics Concern  . Not on file   Social History Narrative   Son Ree Kida has health care POA. WOuld accept resuscitation but doesn't want any prolonged artifical respiration or machines    Review of Systems Diarrhea for a couple of days---may be related to laxatives (though she held miralax) No fever Weight down 10# from my last visit here in July    Objective:   Physical Exam  Constitutional: She appears well-developed. No distress.  Neck: Normal range of motion.  Cardiovascular: Normal rate, regular rhythm and normal heart sounds.  Exam reveals no gallop.   No murmur heard. Pulmonary/Chest: Effort normal and breath sounds normal. No respiratory distress. She has no wheezes. She has no rales.  Abdominal: Soft. Bowel sounds are normal. There is no tenderness.  Musculoskeletal: She exhibits no edema and no tenderness.  Lymphadenopathy:    She has no cervical adenopathy.          Assessment & Plan:

## 2012-06-23 NOTE — Assessment & Plan Note (Signed)
Longstanding angina Seems better on isosorbide without apparent side effects Will continue

## 2012-06-23 NOTE — Assessment & Plan Note (Signed)
Seems better now Concerns about overdiuresis but hard to judge now Monitoring weight Will continue current diuretics Check met b Will make med link referral

## 2012-06-23 NOTE — Assessment & Plan Note (Signed)
Regular on the amiodarone Still on coumadin

## 2012-06-23 NOTE — Assessment & Plan Note (Signed)
Prerenal? Will recheck labs Is monitoring weight closely May need to cut back more

## 2012-06-24 ENCOUNTER — Other Ambulatory Visit: Payer: Self-pay | Admitting: *Deleted

## 2012-06-24 MED ORDER — ISOSORBIDE MONONITRATE ER 30 MG PO TB24: 30.0000 mg | ORAL_TABLET | Freq: Every day | ORAL | Status: DC

## 2012-06-24 MED ORDER — HYDROCODONE-ACETAMINOPHEN 5-325 MG PO TABS: 1.0000 | ORAL_TABLET | Freq: Every evening | ORAL | Status: DC | PRN

## 2012-06-24 NOTE — Telephone Encounter (Signed)
Okay #60 x 0 

## 2012-06-24 NOTE — Telephone Encounter (Signed)
rx called into pharmacy

## 2012-07-07 ENCOUNTER — Encounter: Payer: Self-pay | Admitting: Internal Medicine

## 2012-07-07 ENCOUNTER — Ambulatory Visit (INDEPENDENT_AMBULATORY_CARE_PROVIDER_SITE_OTHER): Payer: Medicare Other | Admitting: Internal Medicine

## 2012-07-07 VITALS — BP 122/60 | HR 63 | Temp 97.9°F | Wt 207.0 lb

## 2012-07-07 DIAGNOSIS — I4891 Unspecified atrial fibrillation: Secondary | ICD-10-CM

## 2012-07-07 DIAGNOSIS — I5032 Chronic diastolic (congestive) heart failure: Secondary | ICD-10-CM

## 2012-07-07 DIAGNOSIS — I509 Heart failure, unspecified: Secondary | ICD-10-CM

## 2012-07-07 DIAGNOSIS — Z5181 Encounter for therapeutic drug level monitoring: Secondary | ICD-10-CM

## 2012-07-07 DIAGNOSIS — Z7901 Long term (current) use of anticoagulants: Secondary | ICD-10-CM

## 2012-07-07 DIAGNOSIS — I10 Essential (primary) hypertension: Secondary | ICD-10-CM

## 2012-07-07 DIAGNOSIS — M159 Polyosteoarthritis, unspecified: Secondary | ICD-10-CM

## 2012-07-07 LAB — BASIC METABOLIC PANEL
BUN: 21 mg/dL (ref 6–23)
CO2: 33 mEq/L — ABNORMAL HIGH (ref 19–32)
Calcium: 9 mg/dL (ref 8.4–10.5)
GFR: 46.34 mL/min — ABNORMAL LOW (ref >60.00)
Glucose, Bld: 105 mg/dL — ABNORMAL HIGH (ref 70–99)
Sodium: 134 mEq/L — ABNORMAL LOW (ref 135–145)

## 2012-07-07 NOTE — Patient Instructions (Signed)
Please take the furosemide every day. Only take the metolozone if your weight at home is 205# or over

## 2012-07-07 NOTE — Assessment & Plan Note (Signed)
Good rate control On coumadin 

## 2012-07-07 NOTE — Progress Notes (Signed)
Subjective:    Patient ID: Deborah Conway, female    DOB: 09-05-1921, 78 y.o.   MRN: [id number]  HPI Here with granddaughter [name] Had stomach virus after getting home from hospital This is better  Has bid caregivers now 4 hours in AM and 2 hours in PM  Feels better overall Breathing is better Stamina has improved----weakness is resolving Uses oxygen much of the time  Weight up 9# since last visit Has stopped the diuretics until her weight jumped up  No palpitations Did have sense of "filling with water" until she went back on diuretics Some back pain on left but no sig chest pain--hasn't needed NTG lately Breathing is fairly good  Still has arthritis pain Uses tramadol about bid usually Uses the hydrocodone at bedtime  Current Outpatient Prescriptions on File Prior to Visit  Medication Sig Dispense Refill  . albuterol (PROVENTIL HFA;VENTOLIN HFA) 108 (90 BASE) MCG/ACT inhaler Inhale 2 puffs into the lungs 4 (four) times daily.  8.5 g  2  . amiodarone (PACERONE) 200 MG tablet Take 1 tablet (200 mg total) by mouth daily.  30 tablet  6  . aspirin 81 MG tablet Take 1 tablet (81 mg total) by mouth daily.  30 tablet  6  . fish oil-omega-3 fatty acids 1000 MG capsule Take 2 g by mouth daily.        . furosemide (LASIX) 20 MG tablet Take 20 mg by mouth daily.      Marland Kitchen HYDROcodone-acetaminophen (NORCO/VICODIN) 5-325 MG per tablet Take 1-2 tablets by mouth at bedtime as needed for pain.  60 tablet  0  . isosorbide mononitrate (IMDUR) 30 MG 24 hr tablet Take 1 tablet (30 mg total) by mouth daily.  30 tablet  6  . lisinopril (PRINIVIL,ZESTRIL) 5 MG tablet Take 5 mg by mouth daily.        . nitroGLYCERIN (NITROSTAT) 0.4 MG SL tablet Place 0.4 mg under the tongue every 5 (five) minutes as needed.        Marland Kitchen omeprazole (PRILOSEC) 20 MG capsule Take 1 capsule (20 mg total) by mouth 2 (two) times daily.  60 capsule  11  . ondansetron (ZOFRAN-ODT) 4 MG disintegrating tablet Take 4 mg by mouth  every 6 (six) hours as needed.      . potassium chloride (K-DUR) 10 MEQ tablet Take 10 mEq by mouth daily.       . sucralfate (CARAFATE) 1 G tablet Take 1 tablet (1 g total) by mouth 3 (three) times daily before meals.  90 tablet  3  . traMADol (ULTRAM) 50 MG tablet Take 1 tablet (50 mg total) by mouth every 6 (six) hours as needed.  90 tablet  0  . warfarin (COUMADIN) 4 MG tablet Take 1 tablet (4 mg total) by mouth daily.  90 tablet  3  . [DISCONTINUED] metolazone (ZAROXOLYN) 2.5 MG tablet Take 1 tablet (2.5 mg total) by mouth daily.  30 tablet  1    Allergies  Allergen Reactions  . Ezetimibe     REACTION: sever myalgias  . Ibuprofen   . Rosuvastatin     REACTION: leg cramps  . Simvastatin     REACTION: leg cramps    Past Medical History  Diagnosis Date  . History of colon cancer   . CAD (coronary artery disease)   . GERD (gastroesophageal reflux disease)   . Hyperlipidemia   . Hypertension   . Rheumatoid arthritis   . Osteoarthritis   . Atrial fibrillation   .  Stroke   . Pulmonary hypertension   . CHF (congestive heart failure)   . Chest pain     5/09 Chest pain--cath at [facility] shows patent stent and no sig obstructive disease  . SOB (shortness of breath)     Admitted with SOB 11/10-------------severe pulm HTN found on echo    Past Surgical History  Procedure Date  . Appendectomy   . Tonsillectomy   . Carotid stent   . Partial colectomy 1989    colon  . Vaginal delivery     x1  . Cataract extraction 2007    left  . Total knee arthroplasty 11/09    right  . Ablation of dysrhythmic focus 11/11    Ablation for atrial flutter  . Cardiac catheterization     Cath severe MR good LV EF, mild CAD 05/2005    Family History  Problem Relation Age of Onset  . Heart disease Mother   . Heart disease Father   . Diabetes Son   . Cancer Paternal Aunt     colon cancer  . Cancer Brother     lung cancer    History   Social History  . Marital Status: Widowed    Spouse  Name: N/A    Number of Children: 1  . Years of Education: N/A   Occupational History  . retiredTEFL teacher    Social History Main Topics  . Smoking status: Never Smoker   . Smokeless tobacco: Never Used  . Alcohol Use: No  . Drug Use: No  . Sexually Active: Not on file   Other Topics Concern  . Not on file   Social History Narrative   Son Ree Kida has health care POA. WOuld accept resuscitation but doesn't want any prolonged artifical respiration or machines   Review of Systems Appetite is improved again---still doesn't eat a lot though Sleeps fairly well    Objective:   Physical Exam  Constitutional: She appears well-developed and well-nourished. No distress.  Neck: Normal range of motion. Neck supple. No thyromegaly present.  Cardiovascular: Normal rate, regular rhythm and normal heart sounds.  Exam reveals no gallop.   No murmur heard. Pulmonary/Chest: Effort normal and breath sounds normal. No respiratory distress. She has no wheezes. She has no rales.  Musculoskeletal: She exhibits no edema and no tenderness.  Lymphadenopathy:    She has no cervical adenopathy.  Psychiatric: She has a normal mood and affect. Her behavior is normal.          Assessment & Plan:

## 2012-07-07 NOTE — Assessment & Plan Note (Signed)
Doing fine with the current analgesics

## 2012-07-07 NOTE — Addendum Note (Signed)
Addended by: Liane Comber C on: 07/07/2012 11:14 AM   Modules accepted: Orders

## 2012-07-07 NOTE — Assessment & Plan Note (Signed)
Doing better now Had been overdiuresed when she had GI bug Now seems to be back at baseline Will recheck labs Discussed diuretic dosing

## 2012-07-07 NOTE — Assessment & Plan Note (Signed)
BP Readings from Last 3 Encounters:  07/07/12 122/60  06/23/12 120/70  03/03/12 126/64   Good control No changes needed

## 2012-07-08 ENCOUNTER — Other Ambulatory Visit: Payer: Self-pay | Admitting: *Deleted

## 2012-07-08 MED ORDER — FUROSEMIDE 20 MG PO TABS: 20.0000 mg | ORAL_TABLET | Freq: Every day | ORAL | Status: DC

## 2012-07-09 ENCOUNTER — Encounter: Payer: Self-pay | Admitting: *Deleted

## 2012-07-30 ENCOUNTER — Telehealth: Payer: Self-pay | Admitting: Internal Medicine

## 2012-07-30 NOTE — Telephone Encounter (Signed)
Patient states she received a call about an appointment.  She is worried she gave the wrong phone number for her daughter.  Her daughter's number is [phone].  She thinks our office called her about her [facility] appt.

## 2012-08-03 ENCOUNTER — Ambulatory Visit (INDEPENDENT_AMBULATORY_CARE_PROVIDER_SITE_OTHER): Payer: Medicare Other | Admitting: General Practice

## 2012-08-03 DIAGNOSIS — Z7901 Long term (current) use of anticoagulants: Secondary | ICD-10-CM

## 2012-08-03 DIAGNOSIS — J209 Acute bronchitis, unspecified: Secondary | ICD-10-CM

## 2012-08-03 DIAGNOSIS — I10 Essential (primary) hypertension: Secondary | ICD-10-CM

## 2012-08-03 DIAGNOSIS — I509 Heart failure, unspecified: Secondary | ICD-10-CM

## 2012-08-03 DIAGNOSIS — M159 Polyosteoarthritis, unspecified: Secondary | ICD-10-CM

## 2012-08-03 DIAGNOSIS — I4891 Unspecified atrial fibrillation: Secondary | ICD-10-CM

## 2012-08-03 NOTE — Patient Instructions (Signed)
Take 7.5 mg of coumadin today and take 6 mg of coumadin tomorrow (Tuesday) and then continue to take 4 mg daily except 5 mg M/W/F Return in 10 days.

## 2012-08-04 ENCOUNTER — Other Ambulatory Visit: Payer: Self-pay | Admitting: *Deleted

## 2012-08-04 MED ORDER — AMIODARONE HCL 200 MG PO TABS: 200.0000 mg | ORAL_TABLET | Freq: Every day | ORAL | Status: DC

## 2012-08-07 ENCOUNTER — Other Ambulatory Visit: Payer: Self-pay

## 2012-08-07 MED ORDER — HYDROCODONE-ACETAMINOPHEN 5-325 MG PO TABS: 1.0000 | ORAL_TABLET | Freq: Every evening | ORAL | Status: DC | PRN

## 2012-08-07 NOTE — Telephone Encounter (Signed)
[facility] faxed refill hydrocodone appa 5-325 mg . Last filled 06/24/12.Please advise.

## 2012-08-07 NOTE — Telephone Encounter (Signed)
Medicine called to [facility]. 

## 2012-08-10 ENCOUNTER — Ambulatory Visit (INDEPENDENT_AMBULATORY_CARE_PROVIDER_SITE_OTHER): Payer: Medicare Other | Admitting: General Practice

## 2012-08-10 DIAGNOSIS — I4891 Unspecified atrial fibrillation: Secondary | ICD-10-CM

## 2012-08-10 DIAGNOSIS — Z5181 Encounter for therapeutic drug level monitoring: Secondary | ICD-10-CM

## 2012-08-10 DIAGNOSIS — Z7901 Long term (current) use of anticoagulants: Secondary | ICD-10-CM

## 2012-08-10 LAB — POCT INR: INR: 1.7

## 2012-08-10 NOTE — Patient Instructions (Signed)
Take 7.5 mg of coumadin today and take 6 mg of coumadin tomorrow (Tuesday) and then continue to take 4 mg daily except 5 mg M/W/F Return in 3 weeks.

## 2012-08-24 ENCOUNTER — Other Ambulatory Visit: Payer: Self-pay | Admitting: *Deleted

## 2012-08-24 MED ORDER — METOLAZONE 2.5 MG PO TABS: 2.5000 mg | ORAL_TABLET | Freq: Every day | ORAL | Status: DC | PRN

## 2012-08-31 ENCOUNTER — Ambulatory Visit (INDEPENDENT_AMBULATORY_CARE_PROVIDER_SITE_OTHER): Payer: Medicare Other | Admitting: General Practice

## 2012-08-31 ENCOUNTER — Ambulatory Visit (INDEPENDENT_AMBULATORY_CARE_PROVIDER_SITE_OTHER): Payer: Medicare Other | Admitting: Internal Medicine

## 2012-08-31 ENCOUNTER — Encounter: Payer: Self-pay | Admitting: Internal Medicine

## 2012-08-31 VITALS — BP 110/58 | HR 66 | Temp 98.6°F | Wt 211.0 lb

## 2012-08-31 DIAGNOSIS — I4891 Unspecified atrial fibrillation: Secondary | ICD-10-CM

## 2012-08-31 DIAGNOSIS — Z5181 Encounter for therapeutic drug level monitoring: Secondary | ICD-10-CM

## 2012-08-31 DIAGNOSIS — I5032 Chronic diastolic (congestive) heart failure: Secondary | ICD-10-CM

## 2012-08-31 DIAGNOSIS — K219 Gastro-esophageal reflux disease without esophagitis: Secondary | ICD-10-CM

## 2012-08-31 DIAGNOSIS — I2510 Atherosclerotic heart disease of native coronary artery without angina pectoris: Secondary | ICD-10-CM

## 2012-08-31 DIAGNOSIS — Z7901 Long term (current) use of anticoagulants: Secondary | ICD-10-CM

## 2012-08-31 DIAGNOSIS — I509 Heart failure, unspecified: Secondary | ICD-10-CM

## 2012-08-31 DIAGNOSIS — M1710 Unilateral primary osteoarthritis, unspecified knee: Secondary | ICD-10-CM

## 2012-08-31 NOTE — Patient Instructions (Signed)
Take 7.5 mg of coumadin today and tomorrow and then change dosage to 5 mg all days except 7.5 mg on Monday.

## 2012-08-31 NOTE — Assessment & Plan Note (Signed)
Weight is up but breathing is better Regular furosemide and weight based zaroxlyn Functional status is better

## 2012-08-31 NOTE — Assessment & Plan Note (Signed)
Rate is controlled Monitoring coumadin in our clinic

## 2012-08-31 NOTE — Assessment & Plan Note (Signed)
Rare angina Discussed using the NTG under tongue for angina

## 2012-08-31 NOTE — Assessment & Plan Note (Signed)
Doing well Continue both meds

## 2012-08-31 NOTE — Progress Notes (Signed)
Subjective:    Patient ID: Deborah Conway, female    DOB: 1922/01/31, 79 y.o.   MRN: [id number]  HPI Here with granddaughter Vanessa Kick Feels good  Has gained 4# since last visit Didn't take diuretic today --waiting till she gets home Breathing has been better lately Does take the zaroxlyn if her weight is up--as directed One spell of chest pain at night---lasted 1.5 hours or so. Took imdur then Generally takes the imdur but misses doses at times Discussed using the NTG for acute pain issues Feels she is "able to breathe all the way to the bottom of my lungs" No recent palpitations Mild dizziness-no nausea. More like "imbalance" walking down the hall. Better on its own  Knee pain is worse Left knee has some swelling Harder to walk  Current Outpatient Prescriptions on File Prior to Visit  Medication Sig Dispense Refill  . albuterol (PROVENTIL HFA;VENTOLIN HFA) 108 (90 BASE) MCG/ACT inhaler Inhale 2 puffs into the lungs 4 (four) times daily.  8.5 g  2  . amiodarone (PACERONE) 200 MG tablet Take 1 tablet (200 mg total) by mouth daily.  30 tablet  6  . aspirin 81 MG tablet Take 1 tablet (81 mg total) by mouth daily.  30 tablet  6  . fish oil-omega-3 fatty acids 1000 MG capsule Take 2 g by mouth daily.        . furosemide (LASIX) 20 MG tablet Take 1 tablet (20 mg total) by mouth daily.  30 tablet  6  . HYDROcodone-acetaminophen (NORCO/VICODIN) 5-325 MG per tablet Take 1-2 tablets by mouth at bedtime as needed for pain.  60 tablet  0  . isosorbide mononitrate (IMDUR) 30 MG 24 hr tablet Take 1 tablet (30 mg total) by mouth daily.  30 tablet  6  . lisinopril (PRINIVIL,ZESTRIL) 5 MG tablet Take 5 mg by mouth daily.        . metolazone (ZAROXOLYN) 2.5 MG tablet Take 1 tablet (2.5 mg total) by mouth daily as needed. If home weight at or over 205#  30 tablet  1  . nitroGLYCERIN (NITROSTAT) 0.4 MG SL tablet Place 0.4 mg under the tongue every 5 (five) minutes as needed.        Marland Kitchen omeprazole  (PRILOSEC) 20 MG capsule Take 1 capsule (20 mg total) by mouth 2 (two) times daily.  60 capsule  11  . ondansetron (ZOFRAN-ODT) 4 MG disintegrating tablet Take 4 mg by mouth every 6 (six) hours as needed.      . potassium chloride (K-DUR) 10 MEQ tablet Take 10 mEq by mouth daily.       . sucralfate (CARAFATE) 1 G tablet Take 1 tablet (1 g total) by mouth 3 (three) times daily before meals.  90 tablet  3  . traMADol (ULTRAM) 50 MG tablet Take 1 tablet (50 mg total) by mouth every 6 (six) hours as needed.  90 tablet  0  . warfarin (COUMADIN) 4 MG tablet Take 1 tablet (4 mg total) by mouth daily.  90 tablet  3    Allergies  Allergen Reactions  . Ezetimibe     REACTION: sever myalgias  . Ibuprofen   . Rosuvastatin     REACTION: leg cramps  . Simvastatin     REACTION: leg cramps    Past Medical History  Diagnosis Date  . History of colon cancer   . CAD (coronary artery disease)   . GERD (gastroesophageal reflux disease)   . Hyperlipidemia   . Hypertension   .  Rheumatoid arthritis   . Osteoarthritis   . Atrial fibrillation   . Stroke   . Pulmonary hypertension   . CHF (congestive heart failure)   . Chest pain     5/09 Chest pain--cath at [facility] shows patent stent and no sig obstructive disease  . SOB (shortness of breath)     Admitted with SOB 11/10-------------severe pulm HTN found on echo    Past Surgical History  Procedure Date  . Appendectomy   . Tonsillectomy   . Carotid stent   . Partial colectomy 1989    colon  . Vaginal delivery     x1  . Cataract extraction 2007    left  . Total knee arthroplasty 11/09    right  . Ablation of dysrhythmic focus 11/11    Ablation for atrial flutter  . Cardiac catheterization     Cath severe MR good LV EF, mild CAD 05/2005    Family History  Problem Relation Age of Onset  . Heart disease Mother   . Heart disease Father   . Diabetes Son   . Cancer Paternal Aunt     colon cancer  . Cancer Brother     lung cancer     History   Social History  . Marital Status: Widowed    Spouse Name: N/A    Number of Children: 1  . Years of Education: N/A   Occupational History  . retiredTEFL teacher    Social History Main Topics  . Smoking status: Never Smoker   . Smokeless tobacco: Never Used  . Alcohol Use: No  . Drug Use: No  . Sexually Active: Not on file   Other Topics Concern  . Not on file   Social History Narrative   Son Ree Kida has health care POA. WOuld accept resuscitation but doesn't want any prolonged artifical respiration or machines  '   Review of Systems Notes her hand "jumps all the time". No functional problems Appetite is good Reflux is quiet now--carafate seems to help. Still on PPI     Objective:   Physical Exam  Constitutional: She appears well-developed and well-nourished. No distress.  Neck: Normal range of motion. No thyromegaly present.  Cardiovascular: Normal rate.  Exam reveals no gallop.   Murmur heard.      Slightly irregular Soft systolic murmur  Pulmonary/Chest: Effort normal and breath sounds normal. No respiratory distress. She has no wheezes. She has no rales.  Musculoskeletal:       Thick calves but no pitting Medial swelling at left knee  Lymphadenopathy:    She has no cervical adenopathy.  Psychiatric: She has a normal mood and affect. Her behavior is normal.          Assessment & Plan:

## 2012-08-31 NOTE — Assessment & Plan Note (Signed)
Worsened  PROCEDURE Sterile prep Medial approach to left knee Local with ethyl chloride and 2cc 2%lidocaine Easy entry into joint---no synovial fluid aspirated 5cc 2%lido/40mg  depomedrol injected  Tolerated well

## 2012-09-07 ENCOUNTER — Ambulatory Visit: Payer: Medicare Other | Admitting: Internal Medicine

## 2012-09-10 ENCOUNTER — Ambulatory Visit (INDEPENDENT_AMBULATORY_CARE_PROVIDER_SITE_OTHER): Payer: Medicare Other | Admitting: General Practice

## 2012-09-10 DIAGNOSIS — Z7901 Long term (current) use of anticoagulants: Secondary | ICD-10-CM

## 2012-09-10 DIAGNOSIS — I4891 Unspecified atrial fibrillation: Secondary | ICD-10-CM

## 2012-09-10 DIAGNOSIS — Z5181 Encounter for therapeutic drug level monitoring: Secondary | ICD-10-CM

## 2012-09-10 LAB — POCT INR: INR: 1.7

## 2012-09-10 NOTE — Patient Instructions (Signed)
Take 7.5 mg of coumadin today  and then change dosage to 5 mg all days except 7.5 mg on Monday and Thurs. Re-check in 3 weeks.

## 2012-09-28 ENCOUNTER — Ambulatory Visit (INDEPENDENT_AMBULATORY_CARE_PROVIDER_SITE_OTHER): Payer: Medicare Other | Admitting: General Practice

## 2012-09-28 DIAGNOSIS — Z5181 Encounter for therapeutic drug level monitoring: Secondary | ICD-10-CM

## 2012-09-28 DIAGNOSIS — Z7901 Long term (current) use of anticoagulants: Secondary | ICD-10-CM

## 2012-09-28 DIAGNOSIS — I4891 Unspecified atrial fibrillation: Secondary | ICD-10-CM

## 2012-09-28 LAB — POCT INR: INR: 2.7

## 2012-09-28 NOTE — Patient Instructions (Signed)
Continue to take 5 mg all days except 7.5 mg on Monday and Thurs. Re-check in 4 weeks.

## 2012-10-01 ENCOUNTER — Other Ambulatory Visit: Payer: Self-pay | Admitting: *Deleted

## 2012-10-01 MED ORDER — HYDROCODONE-ACETAMINOPHEN 5-325 MG PO TABS: 1.0000 | ORAL_TABLET | Freq: Every evening | ORAL | Status: DC | PRN

## 2012-10-01 MED ORDER — WARFARIN SODIUM 5 MG PO TABS: 5.0000 mg | ORAL_TABLET | Freq: Every day | ORAL | Status: DC

## 2012-10-01 NOTE — Telephone Encounter (Signed)
Okay #60 x 0 

## 2012-10-01 NOTE — Telephone Encounter (Signed)
rx called into pharmacy

## 2012-10-01 NOTE — Telephone Encounter (Signed)
Last filled 08/07/12

## 2012-10-02 ENCOUNTER — Other Ambulatory Visit: Payer: Self-pay

## 2012-10-02 ENCOUNTER — Telehealth: Payer: Self-pay

## 2012-10-02 MED ORDER — FUROSEMIDE 40 MG PO TABS: 40.0000 mg | ORAL_TABLET | Freq: Every day | ORAL | Status: DC

## 2012-10-02 NOTE — Telephone Encounter (Signed)
I received correspondence from pharmacy asking for clarification re: pt's lasix and metolazone Asks if pt needs both meds  After discussing with Dr. Mariah Milling and reviewing last nite from Dr. Demaris Callander, it appears pt should be taking lasix daily with metolazone PRN.  Dr. Mariah Milling asks that I call pt to clarify doses  I called pt who asks that I call her grand daughter, [name], who manages meds. Pt thinks she is taking lasix 40 mg BID  I spoke with [name], who says pt is taking lasix 40 mg PO AD and metolazone PRN  I told her I would refill med to [facility] as requested  6 month f/u scheduled with [name] as well

## 2012-10-05 ENCOUNTER — Telehealth: Payer: Self-pay | Admitting: Internal Medicine

## 2012-10-05 NOTE — Telephone Encounter (Signed)
Patient Information:  Caller Name: [name]  Phone: [phone]  Patient: Deborah Conway, Deborah Conway  Gender: Female  DOB: 06/11/1922  Age: 80 Years  PCP: [facility]  Office Follow Up:  Does the office need to follow up with this patient?: No  Instructions For The Office: N/A  RN Note:  Granddaughter calling regarding brusing on Pt's Right Arm. Pt rung out wash cloth w/ hands approx 1 weeks ago, felt a pop in Right Arm, 1 day later bruise appeared inside forearm, Pt felt pain in Right Bisup, bruise size of canalope appeared.  Pt denies injury or bleeding.  Office is closed due to inclement weather.  Gdaughter will call office on 3-4 due to weather, Gdaughter not certain when to schedule the appt.  Symptoms  Reason For Call & Symptoms: Bruising without Injury, on Coumadin  Reviewed Health History In EMR: Yes  Reviewed Medications In EMR: Yes  Reviewed Allergies In EMR: Yes  Reviewed Surgeries / Procedures: Yes  Date of Onset of Symptoms: 10/01/2012  Guideline(s) Used:  Skin Injury  Disposition Per Guideline:   Go to Office Now  Reason For Disposition Reached:   Expanding raised bruise with size > 2 inches (5 cm)  Advice Given:  N/A  RN Overrode Recommendation:  Follow Up With Office Later  Office closed due to inclement weather, Gdaughter will call back on 3-4 for appt.

## 2012-10-06 NOTE — Telephone Encounter (Signed)
Please check on her and schedule appt today if they want her checked

## 2012-10-06 NOTE — Telephone Encounter (Signed)
I left a message on [name]'s voicemail this morning and asked her to call me if she wanted patient seen today at 12:30.  I didn't receive a call back.

## 2012-10-06 NOTE — Telephone Encounter (Signed)
She probably just has a partial biceps tear If they use ice and compression, probably not much more can be done

## 2012-10-08 ENCOUNTER — Other Ambulatory Visit: Payer: Self-pay | Admitting: *Deleted

## 2012-10-08 NOTE — Telephone Encounter (Signed)
Last filled 07/20/2012 

## 2012-10-09 NOTE — Telephone Encounter (Signed)
Okay #90 x 0 

## 2012-10-10 MED ORDER — TRAMADOL HCL 50 MG PO TABS: 50.0000 mg | ORAL_TABLET | Freq: Four times a day (QID) | ORAL | Status: DC | PRN

## 2012-10-10 NOTE — Telephone Encounter (Signed)
Sent to [facility].

## 2012-10-12 ENCOUNTER — Ambulatory Visit (INDEPENDENT_AMBULATORY_CARE_PROVIDER_SITE_OTHER): Payer: Medicare Other | Admitting: General Practice

## 2012-10-12 ENCOUNTER — Other Ambulatory Visit: Payer: Self-pay | Admitting: *Deleted

## 2012-10-12 DIAGNOSIS — I4891 Unspecified atrial fibrillation: Secondary | ICD-10-CM

## 2012-10-12 DIAGNOSIS — Z5181 Encounter for therapeutic drug level monitoring: Secondary | ICD-10-CM

## 2012-10-12 DIAGNOSIS — Z7901 Long term (current) use of anticoagulants: Secondary | ICD-10-CM

## 2012-10-12 LAB — POCT INR: INR: 2

## 2012-10-12 MED ORDER — TRAMADOL HCL 50 MG PO TABS: 50.0000 mg | ORAL_TABLET | Freq: Four times a day (QID) | ORAL | Status: DC | PRN

## 2012-10-12 NOTE — Patient Instructions (Signed)
Take 5 mg all days except 7.5 mg on Thurs. Re-check in 2 weeks.

## 2012-10-13 ENCOUNTER — Other Ambulatory Visit: Payer: Self-pay | Admitting: *Deleted

## 2012-10-13 MED ORDER — SUCRALFATE 1 G PO TABS: 1.0000 g | ORAL_TABLET | Freq: Three times a day (TID) | ORAL | Status: DC

## 2012-10-26 ENCOUNTER — Ambulatory Visit: Payer: Medicare Other | Admitting: Cardiovascular Disease

## 2012-10-26 ENCOUNTER — Ambulatory Visit: Payer: Medicare Other

## 2012-10-26 ENCOUNTER — Other Ambulatory Visit: Payer: Self-pay | Admitting: *Deleted

## 2012-10-26 MED ORDER — METOLAZONE 2.5 MG PO TABS: 2.5000 mg | ORAL_TABLET | Freq: Every day | ORAL | Status: DC | PRN

## 2012-10-27 ENCOUNTER — Other Ambulatory Visit: Payer: Self-pay | Admitting: *Deleted

## 2012-10-27 MED ORDER — OMEPRAZOLE 20 MG PO CPDR: 20.0000 mg | DELAYED_RELEASE_CAPSULE | Freq: Two times a day (BID) | ORAL | Status: DC

## 2012-10-27 MED ORDER — LISINOPRIL 5 MG PO TABS: 5.0000 mg | ORAL_TABLET | Freq: Every day | ORAL | Status: DC

## 2012-10-27 NOTE — Telephone Encounter (Signed)
rx sent to pharmacy by e-script  

## 2012-10-29 ENCOUNTER — Ambulatory Visit (INDEPENDENT_AMBULATORY_CARE_PROVIDER_SITE_OTHER): Payer: Medicare Other | Admitting: General Practice

## 2012-10-29 DIAGNOSIS — Z7901 Long term (current) use of anticoagulants: Secondary | ICD-10-CM

## 2012-10-29 DIAGNOSIS — I4891 Unspecified atrial fibrillation: Secondary | ICD-10-CM

## 2012-10-29 DIAGNOSIS — Z5181 Encounter for therapeutic drug level monitoring: Secondary | ICD-10-CM

## 2012-10-29 LAB — POCT INR: INR: 3.1

## 2012-10-29 NOTE — Patient Instructions (Signed)
Skip coumadin today and then continue to take 5 mg all days except 7.5 mg on Thurs. Re-check in 3 weeks.

## 2012-11-23 ENCOUNTER — Encounter: Payer: Self-pay | Admitting: General Practice

## 2012-11-23 ENCOUNTER — Ambulatory Visit (INDEPENDENT_AMBULATORY_CARE_PROVIDER_SITE_OTHER): Payer: Medicare Other | Admitting: General Practice

## 2012-11-23 DIAGNOSIS — Z5181 Encounter for therapeutic drug level monitoring: Secondary | ICD-10-CM

## 2012-11-23 DIAGNOSIS — Z7901 Long term (current) use of anticoagulants: Secondary | ICD-10-CM

## 2012-11-23 DIAGNOSIS — I4891 Unspecified atrial fibrillation: Secondary | ICD-10-CM

## 2012-11-23 NOTE — Patient Instructions (Addendum)
Continue to take 5 mg all days except 7.5 mg on Thurs. Re-check in 4 weeks.

## 2012-11-30 ENCOUNTER — Ambulatory Visit (INDEPENDENT_AMBULATORY_CARE_PROVIDER_SITE_OTHER): Payer: Medicare Other | Admitting: Internal Medicine

## 2012-11-30 ENCOUNTER — Encounter: Payer: Self-pay | Admitting: Internal Medicine

## 2012-11-30 VITALS — BP 110/60 | HR 57 | Temp 98.2°F | Wt 212.0 lb

## 2012-11-30 DIAGNOSIS — M159 Polyosteoarthritis, unspecified: Secondary | ICD-10-CM

## 2012-11-30 DIAGNOSIS — I2510 Atherosclerotic heart disease of native coronary artery without angina pectoris: Secondary | ICD-10-CM

## 2012-11-30 DIAGNOSIS — I5032 Chronic diastolic (congestive) heart failure: Secondary | ICD-10-CM

## 2012-11-30 DIAGNOSIS — F411 Generalized anxiety disorder: Secondary | ICD-10-CM

## 2012-11-30 DIAGNOSIS — F419 Anxiety disorder, unspecified: Secondary | ICD-10-CM

## 2012-11-30 DIAGNOSIS — I509 Heart failure, unspecified: Secondary | ICD-10-CM

## 2012-11-30 DIAGNOSIS — I10 Essential (primary) hypertension: Secondary | ICD-10-CM

## 2012-11-30 DIAGNOSIS — F39 Unspecified mood [affective] disorder: Secondary | ICD-10-CM | POA: Insufficient documentation

## 2012-11-30 MED ORDER — FUROSEMIDE 40 MG PO TABS: 40.0000 mg | ORAL_TABLET | Freq: Two times a day (BID) | ORAL | Status: DC

## 2012-11-30 MED ORDER — ALPRAZOLAM 0.25 MG PO TABS: 0.2500 mg | ORAL_TABLET | Freq: Two times a day (BID) | ORAL | Status: DC | PRN

## 2012-11-30 NOTE — Assessment & Plan Note (Signed)
Chronic problem but now more noticeable since son's death Clonazepam not the best choice at her age Will Rx alprazolam

## 2012-11-30 NOTE — Progress Notes (Signed)
Subjective:    Patient ID: Deborah Conway, female    DOB: January 24, 1922, 79 y.o.   MRN: [id number]  HPI Here with granddaughter Deborah Conway Lost her son recently---she seems to be working through the process. Now has caregiver from [facility] 4hours a day, 5 days per week  Weight is still up now Went off zaroxlyn when weight went down to 199# Jumped up quickly so restarted the zaroxlyn Discussed going every other or 3rd day  Had 2 bad anginal attacks-- in past couple of weeks Has needed nitro x 3 each time--and it would finally clear up (once in middle of night and once in day) Walks with cane---occ stumbles but no falls Occasionally her heart "doesn't feel right"---but no clear palpitations  Has anxiety spells at times Has used Deborah Conway's clonazepam 0.5 with good result Discussed shorter acting med  Terrible arthritis pain Uses the hydrocodone with some success Variable effect from the tramadol  Current Outpatient Prescriptions on File Prior to Visit  Medication Sig Dispense Refill  . albuterol (PROVENTIL HFA;VENTOLIN HFA) 108 (90 BASE) MCG/ACT inhaler Inhale 2 puffs into the lungs 4 (four) times daily.  8.5 g  2  . amiodarone (PACERONE) 200 MG tablet Take 1 tablet (200 mg total) by mouth daily.  30 tablet  6  . aspirin 81 MG tablet Take 1 tablet (81 mg total) by mouth daily.  30 tablet  6  . fish oil-omega-3 fatty acids 1000 MG capsule Take 2 g by mouth daily.        . furosemide (LASIX) 40 MG tablet Take 1 tablet (40 mg total) by mouth daily.  90 tablet  3  . HYDROcodone-acetaminophen (NORCO/VICODIN) 5-325 MG per tablet Take 1-2 tablets by mouth at bedtime as needed for pain.  60 tablet  0  . isosorbide mononitrate (IMDUR) 30 MG 24 hr tablet Take 1 tablet (30 mg total) by mouth daily.  30 tablet  6  . lisinopril (PRINIVIL,ZESTRIL) 5 MG tablet Take 1 tablet (5 mg total) by mouth daily.  30 tablet  11  . metolazone (ZAROXOLYN) 2.5 MG tablet Take 1 tablet (2.5 mg total) by mouth daily as  needed. If home weight at or over 205#  30 tablet  1  . nitroGLYCERIN (NITROSTAT) 0.4 MG SL tablet Place 0.4 mg under the tongue every 5 (five) minutes as needed.        Marland Kitchen omeprazole (PRILOSEC) 20 MG capsule Take 1 capsule (20 mg total) by mouth 2 (two) times daily.  60 capsule  11  . ondansetron (ZOFRAN-ODT) 4 MG disintegrating tablet Take 4 mg by mouth every 6 (six) hours as needed.      . potassium chloride (K-DUR) 10 MEQ tablet Take 10 mEq by mouth daily.       . sucralfate (CARAFATE) 1 G tablet Take 1 tablet (1 g total) by mouth 3 (three) times daily before meals.  90 tablet  3  . traMADol (ULTRAM) 50 MG tablet Take 1 tablet (50 mg total) by mouth every 6 (six) hours as needed.  90 tablet  0  . warfarin (COUMADIN) 5 MG tablet Take 1 tablet (5 mg total) by mouth daily. or as directed  60 tablet  6   No current facility-administered medications on file prior to visit.    Allergies  Allergen Reactions  . Ezetimibe     REACTION: sever myalgias  . Ibuprofen   . Rosuvastatin     REACTION: leg cramps  . Simvastatin  REACTION: leg cramps    Past Medical History  Diagnosis Date  . History of colon cancer   . CAD (coronary artery disease)   . GERD (gastroesophageal reflux disease)   . Hyperlipidemia   . Hypertension   . Rheumatoid arthritis   . Osteoarthritis   . Atrial fibrillation   . Stroke   . Pulmonary hypertension   . CHF (congestive heart failure)   . Chest pain     5/09 Chest pain--cath at [facility] shows patent stent and no sig obstructive disease  . SOB (shortness of breath)     Admitted with SOB 11/10-------------severe pulm HTN found on echo    Past Surgical History  Procedure Laterality Date  . Appendectomy    . Tonsillectomy    . Carotid stent    . Partial colectomy  1989    colon  . Vaginal delivery      x1  . Cataract extraction  2007    left  . Total knee arthroplasty  11/09    right  . Ablation of dysrhythmic focus  11/11    Ablation for atrial  flutter  . Cardiac catheterization      Cath severe MR good LV EF, mild CAD 05/2005    Family History  Problem Relation Age of Onset  . Heart disease Mother   . Heart disease Father   . Diabetes Son   . Cancer Paternal Aunt     colon cancer  . Cancer Brother     lung cancer     Review of Systems Had trouble swallowing this AM---"too weak to chew"-----GD thinks it may be anxiety attack Doesn't sleep great at night--does nap in day though Appetite is fair    Objective:   Physical Exam  Constitutional: She appears well-developed and well-nourished. No distress.  Neck: Normal range of motion. No thyromegaly present.  Cardiovascular: Regular rhythm.   Slow but regular G3/6 systolic murmur  Pulmonary/Chest: Effort normal. No respiratory distress. She has no wheezes. She has no rales.  Decreased breath sounds at bases but clear  Abdominal: Soft. There is no tenderness.  Musculoskeletal:  Thick calves without pitting  Lymphadenopathy:    She has no cervical adenopathy.  Psychiatric: She has a normal mood and affect. Her behavior is normal.          Assessment & Plan:

## 2012-11-30 NOTE — Assessment & Plan Note (Signed)
Recurrent angina needing NTG No sig change overall

## 2012-11-30 NOTE — Assessment & Plan Note (Signed)
Still needs the pain meds regularly

## 2012-11-30 NOTE — Assessment & Plan Note (Signed)
Weight back up again Needs the zaroxlyn more regularly Daughter who is RN will look to this

## 2012-11-30 NOTE — Assessment & Plan Note (Signed)
BP Readings from Last 3 Encounters:  11/30/12 110/60  08/31/12 110/58  07/07/12 122/60   Good control

## 2012-11-30 NOTE — Patient Instructions (Signed)
Please increase the furosemide to twice a day. Use the zaroxlyn regularly also if weight over 200-205#

## 2012-12-21 ENCOUNTER — Other Ambulatory Visit: Payer: Self-pay | Admitting: *Deleted

## 2012-12-21 ENCOUNTER — Ambulatory Visit (INDEPENDENT_AMBULATORY_CARE_PROVIDER_SITE_OTHER): Payer: Medicare Other | Admitting: General Practice

## 2012-12-21 DIAGNOSIS — I4891 Unspecified atrial fibrillation: Secondary | ICD-10-CM

## 2012-12-21 DIAGNOSIS — Z7901 Long term (current) use of anticoagulants: Secondary | ICD-10-CM

## 2012-12-21 DIAGNOSIS — Z5181 Encounter for therapeutic drug level monitoring: Secondary | ICD-10-CM

## 2012-12-21 MED ORDER — HYDROCODONE-ACETAMINOPHEN 5-325 MG PO TABS: 1.0000 | ORAL_TABLET | Freq: Every evening | ORAL | Status: DC | PRN

## 2012-12-21 MED ORDER — NITROGLYCERIN 0.4 MG SL SUBL: 0.4000 mg | SUBLINGUAL_TABLET | SUBLINGUAL | Status: DC | PRN

## 2012-12-21 NOTE — Telephone Encounter (Signed)
rx sent to pharmacy by e-script rx called into pharmacy  

## 2012-12-21 NOTE — Telephone Encounter (Signed)
Okay hydrocodone #60 x 0  Find out how often she uses the nitro to find out if #100 or #25 makes more sense Okay #11 refills though

## 2012-12-21 NOTE — Telephone Encounter (Signed)
vicodin last filled 10/01/12

## 2012-12-21 NOTE — Patient Instructions (Signed)
Skip coumadin today (5/19) and then continue to take 5 mg all days except 7.5 mg on Thurs. Re-check in 4 weeks.

## 2013-01-11 ENCOUNTER — Encounter: Payer: Self-pay | Admitting: Cardiovascular Disease

## 2013-01-11 ENCOUNTER — Ambulatory Visit (INDEPENDENT_AMBULATORY_CARE_PROVIDER_SITE_OTHER): Payer: Medicare Other | Admitting: Family Medicine

## 2013-01-11 ENCOUNTER — Ambulatory Visit: Payer: Medicare Other

## 2013-01-11 ENCOUNTER — Ambulatory Visit (INDEPENDENT_AMBULATORY_CARE_PROVIDER_SITE_OTHER): Payer: Medicare Other | Admitting: Cardiovascular Disease

## 2013-01-11 VITALS — BP 106/52 | HR 63 | Ht 68.0 in | Wt 205.5 lb

## 2013-01-11 DIAGNOSIS — I4891 Unspecified atrial fibrillation: Secondary | ICD-10-CM

## 2013-01-11 DIAGNOSIS — S8011XA Contusion of right lower leg, initial encounter: Secondary | ICD-10-CM

## 2013-01-11 DIAGNOSIS — Z5181 Encounter for therapeutic drug level monitoring: Secondary | ICD-10-CM

## 2013-01-11 DIAGNOSIS — S8010XA Contusion of unspecified lower leg, initial encounter: Secondary | ICD-10-CM | POA: Insufficient documentation

## 2013-01-11 DIAGNOSIS — I5032 Chronic diastolic (congestive) heart failure: Secondary | ICD-10-CM

## 2013-01-11 DIAGNOSIS — I2510 Atherosclerotic heart disease of native coronary artery without angina pectoris: Secondary | ICD-10-CM

## 2013-01-11 DIAGNOSIS — Z7901 Long term (current) use of anticoagulants: Secondary | ICD-10-CM

## 2013-01-11 DIAGNOSIS — I509 Heart failure, unspecified: Secondary | ICD-10-CM

## 2013-01-11 DIAGNOSIS — I10 Essential (primary) hypertension: Secondary | ICD-10-CM

## 2013-01-11 NOTE — Assessment & Plan Note (Signed)
She'll continue on her diuretics. Granddaughter reports she occasionally skips her Lasix when weight drops.

## 2013-01-11 NOTE — Assessment & Plan Note (Signed)
Blood pressure is very low today. We have suggested she hold her lisinopril and isosorbide

## 2013-01-11 NOTE — Assessment & Plan Note (Addendum)
Recent fall while trying to reach for her cane. INR is 3.7. We have suggested she hold her warfarin for one week before restarting. She is in normal sinus rhythm today.  I'm concerned about her being therapeutic and progression of her hematoma. Granddaughter will closely monitor the size of the hematoma.

## 2013-01-11 NOTE — Assessment & Plan Note (Signed)
Currently with no symptoms of angina. No further workup at this time. Continue current medication regimen. 

## 2013-01-11 NOTE — Patient Instructions (Addendum)
Hold the lisinopril and imdur (isosorbide) Hold the warfarin until hematoma stops progressing (5 days), then restart warfarin  Please call us if you have new issues that need to be addressed before your next appt.  Your physician wants you to follow-up in: 3 months.  You will receive a reminder letter in the mail two months in advance. If you don't receive a letter, please call our office to schedule the follow-up appointment.

## 2013-01-11 NOTE — Assessment & Plan Note (Signed)
Normal sinus rhythm today on second EKG. Initial EKG rhythm was uncertain and appeared to have a short run of possible atrial fibrillation. Associated with this first rhythm, arrhythmia, she felt lightheaded. Granddaughter denies that she has these symptoms on a frequent basis.

## 2013-01-11 NOTE — Progress Notes (Signed)
Patient ID: Deborah Conway, female    DOB: 07-20-1922, 79 y.o.   MRN: [id number]  HPI Comments: 80 year old woman with a history of coronary artery disease, PCI in the past with catheterization in May 2009 showing stable disease, paroxysmal atrial fibrillation, severe pulmonary hypertension by echocardiography done at [facility], long smoking history, not on inhalers, chronic shortness of breath, who presents for routine visit. She is a patient of Dr. Alphonsus Sias.  She reports recent fall yesterday. She was sitting on her bed, reached out for her cane and fell forward. She hit the right eye, right lateral upper thigh. She has huge hematoma on the thigh, large region of ecchymosis around her eye. No vision changes. Significant discomfort on her leg. Granddaughter who presents with her today is very concerned about size of the hematoma. Otherwise she has been doing well. Blood pressure has been running very low at home recently. She was holding her lisinopril on her own and was feeling much better. Blood pressure continues to run low even holding lisinopril.  Echocardiogram from November 2011 shows mild aortic valve regurgitation, ejection fraction 35-40%.  EKG last visit shows normal sinus rhythm with Rate of 59 beats per minute, poor R wave progression through the anterior precordial leads, borderline intraventricular conduction delay Initial EKG showing short run of possible atrial fibrillation, repeat EKG showing normal sinus rhythm    Outpatient Encounter Prescriptions as of 01/11/2013  Medication Sig Dispense Refill  . albuterol (PROVENTIL HFA;VENTOLIN HFA) 108 (90 BASE) MCG/ACT inhaler Inhale 2 puffs into the lungs 4 (four) times daily.  8.5 g  2  . ALPRAZolam (XANAX) 0.25 MG tablet Take 1 tablet (0.25 mg total) by mouth 2 (two) times daily as needed for anxiety.  30 tablet  0  . amiodarone (PACERONE) 200 MG tablet Take 1 tablet (200 mg total) by mouth daily.  30 tablet  6  . aspirin 81 MG tablet  Take 1 tablet (81 mg total) by mouth daily.  30 tablet  6  . fish oil-omega-3 fatty acids 1000 MG capsule Take 2 g by mouth daily.        . furosemide (LASIX) 40 MG tablet Take 40 mg by mouth every other day.      Marland Kitchen HYDROcodone-acetaminophen (NORCO/VICODIN) 5-325 MG per tablet Take 1-2 tablets by mouth at bedtime as needed for pain.  60 tablet  0  . isosorbide mononitrate (IMDUR) 30 MG 24 hr tablet Take 15 mg by mouth daily.      . metolazone (ZAROXOLYN) 2.5 MG tablet Take 2.5 mg by mouth daily. If home weight at or over 205#      . nitroGLYCERIN (NITROSTAT) 0.4 MG SL tablet Place 1 tablet (0.4 mg total) under the tongue every 5 (five) minutes as needed.  25 tablet  11  . omeprazole (PRILOSEC) 20 MG capsule Take 1 capsule (20 mg total) by mouth 2 (two) times daily.  60 capsule  11  . ondansetron (ZOFRAN-ODT) 4 MG disintegrating tablet Take 4 mg by mouth every 6 (six) hours as needed.      . potassium chloride (K-DUR) 10 MEQ tablet Take 10 mEq by mouth daily.       . sucralfate (CARAFATE) 1 G tablet Take 1 tablet (1 g total) by mouth 3 (three) times daily before meals.  90 tablet  3  . traMADol (ULTRAM) 50 MG tablet Take 1 tablet (50 mg total) by mouth every 6 (six) hours as needed.  90 tablet  0  .  warfarin (COUMADIN) 5 MG tablet Take 1 tablet (5 mg total) by mouth daily. or as directed  60 tablet  6   Review of Systems  Constitutional: Negative.   HENT: Negative.   Eyes: Negative.   Respiratory: Negative.   Cardiovascular: Negative.   Gastrointestinal: Negative.   Musculoskeletal: Positive for back pain and gait problem.       Hematoma of her right leg  upper thigh, lateral aspect  Skin: Negative.   Neurological: Negative.   Psychiatric/Behavioral: Negative.   All other systems reviewed and are negative.    BP 106/52  Pulse 63  Ht 5\' 8"  (1.727 m)  Wt 205 lb 8 oz (93.214 kg)  BMI 31.25 kg/m2  Physical Exam  Nursing note and vitals reviewed. Constitutional: She is oriented to  person, place, and time. She appears well-developed and well-nourished.  HENT:  Head: Normocephalic.  Nose: Nose normal.  Mouth/Throat: Oropharynx is clear and moist.  Eyes: Conjunctivae are normal. Pupils are equal, round, and reactive to light.  Neck: Normal range of motion. Neck supple. No JVD present.  Cardiovascular: Normal rate, regular rhythm, S1 normal, S2 normal and intact distal pulses.  Exam reveals no gallop and no friction rub.   Murmur heard.  Crescendo systolic murmur is present with a grade of 2/6  Pulmonary/Chest: Effort normal and breath sounds normal. No respiratory distress. She has no wheezes. She has no rales. She exhibits no tenderness.  Abdominal: Soft. Bowel sounds are normal. She exhibits no distension. There is no tenderness.  Musculoskeletal: Normal range of motion. She exhibits no edema and no tenderness.  Lymphadenopathy:    She has no cervical adenopathy.  Neurological: She is alert and oriented to person, place, and time. Coordination normal.  Skin: Skin is warm and dry. No rash noted. No erythema.  Large region of ecchymosis/ bruising around the right eye also large hematoma of the lateral aspect of the right leg thigh  Psychiatric: She has a normal mood and affect. Her behavior is normal. Judgment and thought content normal.    Assessment and Plan

## 2013-01-11 NOTE — Patient Instructions (Addendum)
Skip coumadin today (6/9) and take 2.5mg  tomorrow (6/10) and then change dose to 5 mg everyday. Re-check in 3 weeks.

## 2013-01-13 ENCOUNTER — Other Ambulatory Visit: Payer: Self-pay | Admitting: *Deleted

## 2013-01-13 MED ORDER — METOLAZONE 2.5 MG PO TABS: 2.5000 mg | ORAL_TABLET | Freq: Every day | ORAL | Status: DC

## 2013-01-13 MED ORDER — POTASSIUM CHLORIDE ER 10 MEQ PO TBCR: 10.0000 meq | EXTENDED_RELEASE_TABLET | Freq: Every day | ORAL | Status: DC

## 2013-01-25 ENCOUNTER — Encounter: Payer: Self-pay | Admitting: Family Medicine

## 2013-01-25 ENCOUNTER — Ambulatory Visit (INDEPENDENT_AMBULATORY_CARE_PROVIDER_SITE_OTHER): Payer: Medicare Other | Admitting: Family Medicine

## 2013-01-25 ENCOUNTER — Ambulatory Visit (INDEPENDENT_AMBULATORY_CARE_PROVIDER_SITE_OTHER)
Admission: RE | Admit: 2013-01-25 | Discharge: 2013-01-25 | Disposition: A | Discharge status: Home / Self Care | Payer: Medicare Other | Source: Ambulatory Visit | Attending: Family Medicine | Admitting: Family Medicine

## 2013-01-25 VITALS — BP 118/60 | HR 56 | Temp 98.2°F | Wt 201.2 lb

## 2013-01-25 DIAGNOSIS — R2689 Other abnormalities of gait and mobility: Secondary | ICD-10-CM | POA: Insufficient documentation

## 2013-01-25 DIAGNOSIS — R05 Cough: Secondary | ICD-10-CM | POA: Insufficient documentation

## 2013-01-25 DIAGNOSIS — R079 Chest pain, unspecified: Secondary | ICD-10-CM

## 2013-01-25 DIAGNOSIS — R059 Cough, unspecified: Secondary | ICD-10-CM

## 2013-01-25 DIAGNOSIS — R0781 Pleurodynia: Secondary | ICD-10-CM | POA: Insufficient documentation

## 2013-01-25 DIAGNOSIS — R269 Unspecified abnormalities of gait and mobility: Secondary | ICD-10-CM

## 2013-01-25 MED ORDER — HYDROCODONE-ACETAMINOPHEN 5-325 MG PO TABS: 1.0000 | ORAL_TABLET | Freq: Three times a day (TID) | ORAL | Status: DC | PRN

## 2013-01-25 NOTE — Assessment & Plan Note (Addendum)
Given persistent productive cough, check CXR today - clear on my read.

## 2013-01-25 NOTE — Patient Instructions (Addendum)
I wonder if some of this imbalance may be coming from increased use of hydrocodone. I do think you may have rib fractures - I'd suggest starting tylenol scheduled 500mg  three times daily, and then may use hydrocodone on top for breakthrough pain - may need less narcotic this way. If not improving with this, please return to see [place]. Healing may take up to 6 weeks.

## 2013-01-25 NOTE — Assessment & Plan Note (Addendum)
Point tender anterior left ribcage - check xrays to eval for fx today. I do suspect posterior ribcage fractures, but exam was rather motion degraded - will await radiology read. I did suggest scheduling tylenol 500mg  tid, and use hydrocodone for breakthrough pain - in hopes of minimizing narcotic. Provided with script for vicodin #20. Discussed anticipated course of resolution.

## 2013-01-25 NOTE — Progress Notes (Signed)
  Subjective:    Patient ID: Deborah Conway, female    DOB: 1921-09-12, 79 y.o.   MRN: [id number]  HPI CC: fall, cough, malaise  Presents with daughter who is an onc Char[employer] fundraiser.  DOI: 01/10/2013 - Had a fall in bathroom.  Hit R thigh, L ribcage, and R face.  On coumadin for afib.  Increased productive cough of white/brown sputum. Occasional dyspnea with wheezing.  Since then, persistent L ribcage pain. Since fall, much more unsteady with walking.  Has had 2 more falls since initial fall. Has been increasing hydrocodone use (3-4 times per day - prior did not take regularly).  Denies fevers/chills, headaches. Wears O2 for CHF and severe pulm HTN. Never smoker.  Lab Results  Component Value Date   INR 3.7 01/11/2013   INR 3.3 12/21/2012   INR 2.2 11/23/2012   Past Medical History  Diagnosis Date  . History of colon cancer   . CAD (coronary artery disease)   . GERD (gastroesophageal reflux disease)   . Hyperlipidemia   . Hypertension   . Rheumatoid arthritis(714.0)   . Osteoarthritis   . Atrial fibrillation   . Stroke   . Pulmonary hypertension   . CHF (congestive heart failure)   . Chest pain     5/09 Chest pain--cath at [facility] shows patent stent and no sig obstructive disease  . SOB (shortness of breath)     Admitted with SOB 11/10-------------severe pulm HTN found on echo  . Anxiety     Review of Systems Per HPI    Objective:   Physical Exam  Nursing note and vitals reviewed. Constitutional: She is oriented to person, place, and time. She appears well-developed and well-nourished. No distress.  HENT:  Mouth/Throat: Oropharynx is clear and moist. No oropharyngeal exudate.  Cardiovascular: Normal rate, regular rhythm, normal heart sounds and intact distal pulses.   No murmur heard. Pulmonary/Chest: Effort normal and breath sounds normal. No respiratory distress. She has no decreased breath sounds. She has no wheezes. She has no rhonchi. She has no rales. She exhibits  tenderness and bony tenderness.    Lungs overall clear somewhat coarse throughout but no focal findings Tender to palpation left anterior ribcage at around 3-5 costals  Musculoskeletal: She exhibits no edema.  Neurological: She is alert and oriented to person, place, and time. No cranial nerve deficit.  CN 2-12 intact       Assessment & Plan:

## 2013-01-25 NOTE — Assessment & Plan Note (Addendum)
After she hit her head, and on coumadin, however denies persistent headache, and has other possible cause of imbalance - increased narcotic use recently. Discussed decreasing narcotic use. If not improving with above, would obtain head CT to eval for subdural.

## 2013-02-01 ENCOUNTER — Ambulatory Visit: Payer: Medicare Other

## 2013-02-01 ENCOUNTER — Other Ambulatory Visit: Payer: Self-pay | Admitting: *Deleted

## 2013-02-01 MED ORDER — WARFARIN SODIUM 1 MG PO TABS: ORAL_TABLET | ORAL | Status: DC

## 2013-02-04 ENCOUNTER — Other Ambulatory Visit: Payer: Self-pay | Admitting: *Deleted

## 2013-02-04 MED ORDER — ALPRAZOLAM 0.25 MG PO TABS: 0.2500 mg | ORAL_TABLET | Freq: Two times a day (BID) | ORAL | Status: DC | PRN

## 2013-02-04 NOTE — Telephone Encounter (Signed)
Okay #30 x 0 

## 2013-02-04 NOTE — Telephone Encounter (Signed)
Last filled 11/30/12

## 2013-02-04 NOTE — Telephone Encounter (Signed)
rx called into pharmacy

## 2013-02-23 ENCOUNTER — Other Ambulatory Visit: Payer: Self-pay | Admitting: *Deleted

## 2013-02-23 MED ORDER — AMIODARONE HCL 200 MG PO TABS: 200.0000 mg | ORAL_TABLET | Freq: Every day | ORAL | Status: DC

## 2013-03-01 ENCOUNTER — Ambulatory Visit: Payer: Medicare Other | Admitting: Internal Medicine

## 2013-03-08 ENCOUNTER — Other Ambulatory Visit: Payer: Self-pay | Admitting: *Deleted

## 2013-03-08 MED ORDER — HYDROCODONE-ACETAMINOPHEN 5-325 MG PO TABS: 1.0000 | ORAL_TABLET | Freq: Three times a day (TID) | ORAL | Status: DC | PRN

## 2013-03-08 NOTE — Telephone Encounter (Signed)
rx called into pharmacy

## 2013-03-08 NOTE — Telephone Encounter (Signed)
plz phone in. 

## 2013-03-08 NOTE — Telephone Encounter (Signed)
Last filled 01/25/2013

## 2013-03-15 ENCOUNTER — Other Ambulatory Visit: Payer: Self-pay | Admitting: *Deleted

## 2013-03-15 MED ORDER — ALPRAZOLAM 0.25 MG PO TABS: 0.2500 mg | ORAL_TABLET | Freq: Two times a day (BID) | ORAL | Status: DC | PRN

## 2013-03-15 NOTE — Telephone Encounter (Signed)
Okay #30 x 0 

## 2013-03-15 NOTE — Telephone Encounter (Signed)
Phoned in to pharmacy. 

## 2013-03-15 NOTE — Telephone Encounter (Signed)
Last filled 02/04/13 

## 2013-03-26 ENCOUNTER — Encounter: Payer: Self-pay | Admitting: Radiology

## 2013-03-29 ENCOUNTER — Ambulatory Visit (INDEPENDENT_AMBULATORY_CARE_PROVIDER_SITE_OTHER): Payer: Medicare Other | Admitting: Family Medicine

## 2013-03-29 ENCOUNTER — Encounter: Payer: Self-pay | Admitting: Internal Medicine

## 2013-03-29 ENCOUNTER — Ambulatory Visit (INDEPENDENT_AMBULATORY_CARE_PROVIDER_SITE_OTHER): Payer: Medicare Other | Admitting: Internal Medicine

## 2013-03-29 VITALS — BP 140/60 | HR 64 | Temp 97.8°F | Wt 210.0 lb

## 2013-03-29 DIAGNOSIS — Z7901 Long term (current) use of anticoagulants: Secondary | ICD-10-CM

## 2013-03-29 DIAGNOSIS — M1710 Unilateral primary osteoarthritis, unspecified knee: Secondary | ICD-10-CM

## 2013-03-29 DIAGNOSIS — I5032 Chronic diastolic (congestive) heart failure: Secondary | ICD-10-CM

## 2013-03-29 DIAGNOSIS — I4891 Unspecified atrial fibrillation: Secondary | ICD-10-CM

## 2013-03-29 DIAGNOSIS — I509 Heart failure, unspecified: Secondary | ICD-10-CM

## 2013-03-29 DIAGNOSIS — F411 Generalized anxiety disorder: Secondary | ICD-10-CM

## 2013-03-29 DIAGNOSIS — Z5181 Encounter for therapeutic drug level monitoring: Secondary | ICD-10-CM

## 2013-03-29 DIAGNOSIS — F419 Anxiety disorder, unspecified: Secondary | ICD-10-CM

## 2013-03-29 DIAGNOSIS — I10 Essential (primary) hypertension: Secondary | ICD-10-CM

## 2013-03-29 LAB — POCT INR: INR: 1

## 2013-03-29 NOTE — Progress Notes (Signed)
Subjective:    Patient ID: Deborah Conway, female    DOB: 01/12/1922, 79 y.o.   MRN: [id number]  HPI Here with granddaughter [name] Feels better  No longer having rib pain Breathing is easier  Having trouble keeping weight down Takes extra furosemide (40mg ) if weight goes up Still needs the metolazone every day---really jumped up in fluid when she held it  Down to 1mg  warfarin daily Decreased dose due to multiple falls and large hematomas No palpitations  No recent chest pain Hasn't needed nitro lately  Arthritis is pretty bad Needs the hydrocodone nightly (tramadol not much help)--- may have 1 other dose 2 tylenol in AM, 500mg  tid otherwise (told her to go down to bid of the 1 extra strength---none at bedtime now) Hips and knees painful. Feet also Seems to have fluid on left knee  Current Outpatient Prescriptions on File Prior to Visit  Medication Sig Dispense Refill  . albuterol (PROVENTIL HFA;VENTOLIN HFA) 108 (90 BASE) MCG/ACT inhaler Inhale 2 puffs into the lungs 4 (four) times daily.  8.5 g  2  . aspirin 81 MG tablet Take 1 tablet (81 mg total) by mouth daily.  30 tablet  6  . fish oil-omega-3 fatty acids 1000 MG capsule Take 2 g by mouth daily.        . furosemide (LASIX) 40 MG tablet Take 20 mg by mouth 2 (two) times daily.       Marland Kitchen NITROSTAT 0.4 MG SL tablet Place 0.4 mg under the tongue every 5 (five) minutes as needed.       . ondansetron (ZOFRAN-ODT) 4 MG disintegrating tablet Take 4 mg by mouth every 6 (six) hours as needed.      . traMADol (ULTRAM) 50 MG tablet Take 50 mg by mouth every 6 (six) hours as needed.        No current facility-administered medications on file prior to visit.    Allergies  Allergen Reactions  . Ezetimibe     REACTION: sever myalgias  . Ibuprofen   . Rosuvastatin     REACTION: leg cramps  . Simvastatin     REACTION: leg cramps    Past Medical History  Diagnosis Date  . History of colon cancer   . CAD (coronary artery disease)    . GERD (gastroesophageal reflux disease)   . Hyperlipidemia   . Hypertension   . Rheumatoid arthritis(714.0)   . Osteoarthritis   . Atrial fibrillation   . Stroke   . Pulmonary hypertension   . CHF (congestive heart failure)   . Chest pain     5/09 Chest pain--cath at [facility] shows patent stent and no sig obstructive disease  . SOB (shortness of breath)     Admitted with SOB 11/10-------------severe pulm HTN found on echo  . Anxiety     Past Surgical History  Procedure Laterality Date  . Appendectomy    . Tonsillectomy    . Carotid stent    . Partial colectomy  1989    colon  . Vaginal delivery      x1  . Cataract extraction  2007    left  . Total knee arthroplasty  11/09    right  . Ablation of dysrhythmic focus  11/11    Ablation for atrial flutter  . Cardiac catheterization      Cath severe MR good LV EF, mild CAD 05/2005    Family History  Problem Relation Age of Onset  . Heart disease Mother   .  Heart disease Father   . Diabetes Son   . Cancer Paternal Aunt     colon cancer  . Cancer Brother     lung cancer    History   Social History  . Marital Status: Widowed    Spouse Name: N/A    Number of Children: 1  . Years of Education: N/A   Occupational History  . retiredTEFL teacher    Social History Main Topics  . Smoking status: Never Smoker   . Smokeless tobacco: Never Used  . Alcohol Use: No  . Drug Use: No  . Sexual Activity: Not on file   Other Topics Concern  . Not on file   Social History Narrative   Granddaughter Vanessa Kick now has health care POA.    Would accept resuscitation but doesn't want any prolonged artifical respiration or machines.   Review of Systems Appetite only just finally improving Sleeping fairly well    Objective:   Physical Exam  Constitutional: She appears well-developed and well-nourished. No distress.  Neck: Normal range of motion. Neck supple.  Cardiovascular: Normal rate.  Exam reveals no gallop.   Murmur  heard. Irregular Gr 2/6 systolic murmur towards apex  Pulmonary/Chest: Effort normal. No respiratory distress. She has no wheezes. She has rales.  Bibasilar coarse crackles  Musculoskeletal:  Effusion in left knee  Lymphadenopathy:    She has no cervical adenopathy.  Psychiatric: She has a normal mood and affect. Her behavior is normal.          Assessment & Plan:

## 2013-03-29 NOTE — Assessment & Plan Note (Signed)
Rate is controlled Some considerable bleeding Not therapeutic at 1mg  a day Will review with Dr Mariah Milling for plan now that hematomas have resolved

## 2013-03-29 NOTE — Assessment & Plan Note (Signed)
Weight up a bit May need some more lasix Breathing is okay though

## 2013-03-29 NOTE — Assessment & Plan Note (Signed)
Procedure  Sterile prep medial approach to left knee Ethyl chloride then 2cc 2% lido local No synovial fluid obtained 40mg  depomedrol and 5cc 2% lidocaine instilled Tolerated well

## 2013-03-29 NOTE — Assessment & Plan Note (Signed)
BP Readings from Last 3 Encounters:  03/29/13 140/60  01/25/13 118/60  01/11/13 106/52   Still controlled

## 2013-03-29 NOTE — Assessment & Plan Note (Signed)
Mood is some better now No changes

## 2013-03-30 NOTE — Patient Instructions (Signed)
Dr. Mariah Milling will assess pt's need to continue Coumadin based on recent hx of falls.

## 2013-04-01 ENCOUNTER — Other Ambulatory Visit: Payer: Self-pay | Admitting: *Deleted

## 2013-04-02 MED ORDER — HYDROCODONE-ACETAMINOPHEN 5-325 MG PO TABS: 1.0000 | ORAL_TABLET | Freq: Three times a day (TID) | ORAL | Status: DC | PRN

## 2013-04-02 NOTE — Telephone Encounter (Signed)
Phoned in to [facility].

## 2013-04-02 NOTE — Progress Notes (Signed)
I think it should be ok to restart.  My main concern going forward will be fall risk, etc. We could just monitor her for further falls. Let me know if you need me to call her. thx tim

## 2013-04-02 NOTE — Telephone Encounter (Signed)
Okay #30 x 0 

## 2013-04-06 ENCOUNTER — Telehealth: Payer: Self-pay | Admitting: Family Medicine

## 2013-04-06 NOTE — Telephone Encounter (Signed)
Pt's granddaughter, Marylu Lund, forgot to tell Dr. Alphonsus Sias during OV last week that she had a letter saying that pt needed to be seen and have documented that she is still using home O2 PRN for medicare benefits purposes.

## 2013-04-06 NOTE — Progress Notes (Signed)
Deborah Conway,  Please get her back on the full dose warfarin and establish a follow up schedule. Dr Mariah Milling feels this would be best, and I agree----let her know this. If she has any bleeding problems, or is falling, we will reevaluate this

## 2013-04-06 NOTE — Progress Notes (Signed)
Instructions given to Marylu Lund, granddaughter. Follow up INR scheduled for September.

## 2013-04-06 NOTE — Telephone Encounter (Signed)
Please find out what we need to do to keep her certified for home oxygen Probably need to check with the home health company that supplies it

## 2013-04-16 ENCOUNTER — Encounter: Payer: Self-pay | Admitting: Internal Medicine

## 2013-04-19 ENCOUNTER — Telehealth: Payer: Self-pay | Admitting: *Deleted

## 2013-04-19 NOTE — Telephone Encounter (Addendum)
Received faxed refill request from pharmacy for Alprazolam. Request does not match medication sheet. The request from that pharmacy shows take one tablet twice daily as needed. Please verify directions. Is it okay to refill medication?

## 2013-04-19 NOTE — Telephone Encounter (Signed)
It had been bid prn and was changed Okay to make it bid prn  #60 x 0

## 2013-04-20 MED ORDER — ALPRAZOLAM 0.25 MG PO TABS: 0.2500 mg | ORAL_TABLET | Freq: Two times a day (BID) | ORAL | Status: DC | PRN

## 2013-04-20 NOTE — Telephone Encounter (Signed)
rx called into pharmacy Med list corrected

## 2013-04-20 NOTE — Telephone Encounter (Signed)
Spoke with granddaughter and she states the pt came home from the nursing home with this over a year ago, she's not sure who the ordering physician was and she thinks the company is [employer] but not sure ( grand-daughter had surgery today) she also states the pt only uses this at night and maybe once in a while during the day. Please advise

## 2013-04-21 NOTE — Telephone Encounter (Signed)
We probably have to order a fasting overnight study if we need to recertify her Can you figure out who she has and find out what they need?

## 2013-04-22 ENCOUNTER — Other Ambulatory Visit: Payer: Self-pay | Admitting: *Deleted

## 2013-04-22 MED ORDER — HYDROCODONE-ACETAMINOPHEN 5-325 MG PO TABS: 1.0000 | ORAL_TABLET | Freq: Three times a day (TID) | ORAL | Status: DC | PRN

## 2013-04-22 NOTE — Telephone Encounter (Signed)
rx called into pharmacy

## 2013-04-22 NOTE — Telephone Encounter (Signed)
Last filled 04/02/13

## 2013-04-22 NOTE — Telephone Encounter (Signed)
Okay #30 x 0 

## 2013-04-23 ENCOUNTER — Ambulatory Visit: Payer: Medicare Other

## 2013-04-30 ENCOUNTER — Ambulatory Visit: Payer: Medicare Other

## 2013-05-03 ENCOUNTER — Ambulatory Visit: Payer: Medicare Other

## 2013-05-06 ENCOUNTER — Ambulatory Visit: Payer: Medicare Other

## 2013-05-10 ENCOUNTER — Other Ambulatory Visit: Payer: Self-pay | Admitting: *Deleted

## 2013-05-10 ENCOUNTER — Ambulatory Visit (INDEPENDENT_AMBULATORY_CARE_PROVIDER_SITE_OTHER): Payer: Medicare Other | Admitting: Family Medicine

## 2013-05-10 DIAGNOSIS — Z5181 Encounter for therapeutic drug level monitoring: Secondary | ICD-10-CM

## 2013-05-10 DIAGNOSIS — Z23 Encounter for immunization: Secondary | ICD-10-CM

## 2013-05-10 DIAGNOSIS — Z7901 Long term (current) use of anticoagulants: Secondary | ICD-10-CM

## 2013-05-10 DIAGNOSIS — I4891 Unspecified atrial fibrillation: Secondary | ICD-10-CM

## 2013-05-10 MED ORDER — HYDROCODONE-ACETAMINOPHEN 5-325 MG PO TABS: 1.0000 | ORAL_TABLET | Freq: Three times a day (TID) | ORAL | Status: DC | PRN

## 2013-05-10 NOTE — Addendum Note (Signed)
Addended by: Criselda Peaches B on: 05/10/2013 03:15 PM   Modules accepted: Orders

## 2013-05-10 NOTE — Telephone Encounter (Signed)
Pt is here for coumadin check and would like a refill of hydrocodone so they won't have to drive back this way, pt is taking 1-2 tabs daily, she last got #30 on 04/22/2013.

## 2013-05-10 NOTE — Patient Instructions (Signed)
Continue to take 5mg daily and recheck in 4 weeks. 

## 2013-05-10 NOTE — Telephone Encounter (Signed)
rx gave to grand-daughter in person, with patient.

## 2013-05-10 NOTE — Telephone Encounter (Signed)
I wrote for #60 since needs written now

## 2013-05-25 ENCOUNTER — Other Ambulatory Visit: Payer: Self-pay | Admitting: *Deleted

## 2013-05-26 MED ORDER — ALPRAZOLAM 0.25 MG PO TABS: 0.2500 mg | ORAL_TABLET | Freq: Two times a day (BID) | ORAL | Status: DC | PRN

## 2013-05-26 MED ORDER — TRAMADOL HCL 50 MG PO TABS: 50.0000 mg | ORAL_TABLET | Freq: Four times a day (QID) | ORAL | Status: DC | PRN

## 2013-05-26 NOTE — Telephone Encounter (Signed)
rx called into pharmacy

## 2013-05-26 NOTE — Telephone Encounter (Signed)
Okay alprazolam #60 x 0 Tramadol #30 x 0

## 2013-06-07 ENCOUNTER — Ambulatory Visit: Payer: Medicare Other

## 2013-06-11 ENCOUNTER — Telehealth: Payer: Self-pay | Admitting: Internal Medicine

## 2013-06-11 NOTE — Telephone Encounter (Signed)
Patient has an appointment with you on 06/14/13.  Marylu Lund is wondering if you could call her before patient's appointment.  Patient is having issues that Marylu Lund is afraid she won't discuss or will downplay at the visit.  Patient is having pain and anxiety.  Her long term care insurance is running out in two weeks. Marylu Lund just had a hysterectomy a week and a half ago.  Please call Marylu Lund.

## 2013-06-11 NOTE — Telephone Encounter (Signed)
Message left Will try back again tomorrow or Monday

## 2013-06-12 NOTE — Telephone Encounter (Signed)
Will be hiring 10 hours per day private pay for now Considering assisted living Will discuss this at the visit----would be a good thing for her probably

## 2013-06-14 ENCOUNTER — Encounter: Payer: Self-pay | Admitting: Internal Medicine

## 2013-06-14 ENCOUNTER — Ambulatory Visit (INDEPENDENT_AMBULATORY_CARE_PROVIDER_SITE_OTHER): Payer: Medicare Other | Admitting: Family Medicine

## 2013-06-14 ENCOUNTER — Ambulatory Visit (INDEPENDENT_AMBULATORY_CARE_PROVIDER_SITE_OTHER): Payer: Medicare Other | Admitting: Internal Medicine

## 2013-06-14 ENCOUNTER — Ambulatory Visit: Payer: Medicare Other

## 2013-06-14 VITALS — BP 140/80 | HR 64 | Temp 98.0°F | Wt 215.0 lb

## 2013-06-14 DIAGNOSIS — Z7901 Long term (current) use of anticoagulants: Secondary | ICD-10-CM

## 2013-06-14 DIAGNOSIS — E785 Hyperlipidemia, unspecified: Secondary | ICD-10-CM

## 2013-06-14 DIAGNOSIS — I5032 Chronic diastolic (congestive) heart failure: Secondary | ICD-10-CM

## 2013-06-14 DIAGNOSIS — I10 Essential (primary) hypertension: Secondary | ICD-10-CM

## 2013-06-14 DIAGNOSIS — M159 Polyosteoarthritis, unspecified: Secondary | ICD-10-CM

## 2013-06-14 DIAGNOSIS — I509 Heart failure, unspecified: Secondary | ICD-10-CM

## 2013-06-14 DIAGNOSIS — F39 Unspecified mood [affective] disorder: Secondary | ICD-10-CM

## 2013-06-14 DIAGNOSIS — I4891 Unspecified atrial fibrillation: Secondary | ICD-10-CM

## 2013-06-14 DIAGNOSIS — Z5181 Encounter for therapeutic drug level monitoring: Secondary | ICD-10-CM

## 2013-06-14 LAB — BASIC METABOLIC PANEL
BUN: 24 mg/dL — ABNORMAL HIGH (ref 6–23)
Calcium: 9.4 mg/dL (ref 8.4–10.5)
Chloride: 82 mEq/L — ABNORMAL LOW (ref 96–112)
Creatinine, Ser: 1.2 mg/dL (ref 0.4–1.2)
GFR: 44.48 mL/min — ABNORMAL LOW (ref >60.00)

## 2013-06-14 LAB — TSH: TSH: 7.83 u[IU]/mL — ABNORMAL HIGH (ref 0.35–5.50)

## 2013-06-14 LAB — LIPID PANEL
Cholesterol: 181 mg/dL (ref 0–200)
HDL: 60.8 mg/dL (ref >39.00)
LDL Cholesterol: 96 mg/dL (ref 0–99)
Total CHOL/HDL Ratio: 3
Triglycerides: 122 mg/dL (ref 0.0–149.0)
VLDL: 24.4 mg/dL (ref 0.0–40.0)

## 2013-06-14 LAB — HEPATIC FUNCTION PANEL
Alkaline Phosphatase: 63 U/L (ref 39–117)
Bilirubin, Direct: 0 mg/dL (ref 0.0–0.3)
Total Bilirubin: 0.7 mg/dL (ref 0.3–1.2)

## 2013-06-14 LAB — CBC WITH DIFFERENTIAL/PLATELET
Basophils Absolute: 0 10*3/uL (ref 0.0–0.1)
Basophils Relative: 0.3 % (ref 0.0–3.0)
Eosinophils Absolute: 0.1 10*3/uL (ref 0.0–0.7)
Hemoglobin: 10.8 g/dL — ABNORMAL LOW (ref 12.0–15.0)
Lymphocytes Relative: 22 % (ref 12.0–46.0)
MCHC: 33.1 g/dL (ref 30.0–36.0)
Monocytes Relative: 9.4 % (ref 3.0–12.0)
Neutro Abs: 3.1 10*3/uL (ref 1.4–7.7)
Neutrophils Relative %: 67 % (ref 43.0–77.0)
RBC: 3.55 Mil/uL — ABNORMAL LOW (ref 3.87–5.11)
RDW: 16.2 % — ABNORMAL HIGH (ref 11.5–14.6)
WBC: 4.6 10*3/uL (ref 4.5–10.5)

## 2013-06-14 MED ORDER — FUROSEMIDE 40 MG PO TABS: 20.0000 mg | ORAL_TABLET | Freq: Two times a day (BID) | ORAL | Status: DC

## 2013-06-14 MED ORDER — HYDROCODONE-ACETAMINOPHEN 5-325 MG PO TABS: 1.0000 | ORAL_TABLET | Freq: Three times a day (TID) | ORAL | Status: AC | PRN

## 2013-06-14 NOTE — Assessment & Plan Note (Signed)
Ongoing anxiety mostly Uses the alprazolam at times I think being alone may be a big part of the problem

## 2013-06-14 NOTE — Progress Notes (Signed)
Subjective:    Patient ID: Deborah Conway, female    DOB: [birth year], 79 y.o.   MRN: [id number]  HPI Here with granddaughter Got hypoxic coming in--didn't take her oxygen Okay with our oxygen on  Doing okay No regular chest pain Rib pain resolved No recent palpitation  Still with arthritis pain  Still can't keep her fluid down Appetite isn't great--- and can taste salt in any food (then her weight goes up) Has daily caregiver--they handle the food prep Home alone most of the time Does have neighbors and visitors  Feels weak Bad anxiety---just walks the halls all night at times  Feels something in her upper abdominal wall Thinks there was something there since her cancer surgery Thinks she coughed up some plastic recently  Current Outpatient Prescriptions on File Prior to Visit  Medication Sig Dispense Refill  . albuterol (PROVENTIL HFA;VENTOLIN HFA) 108 (90 BASE) MCG/ACT inhaler Inhale 2 puffs into the lungs 4 (four) times daily.  8.5 g  2  . ALPRAZolam (XANAX) 0.25 MG tablet Take 1 tablet (0.25 mg total) by mouth 2 (two) times daily as needed.  60 tablet  0  . amiodarone (PACERONE) 200 MG tablet Take 200 mg by mouth daily.       Marland Kitchen aspirin 81 MG tablet Take 1 tablet (81 mg total) by mouth daily.  30 tablet  6  . fish oil-omega-3 fatty acids 1000 MG capsule Take 2 g by mouth daily.        . furosemide (LASIX) 40 MG tablet Take 20 mg by mouth 2 (two) times daily.       Marland Kitchen HYDROcodone-acetaminophen (NORCO/VICODIN) 5-325 MG per tablet Take 1 tablet by mouth every 8 (eight) hours as needed.  60 tablet  0  . metolazone (ZAROXOLYN) 2.5 MG tablet Take 2.5 mg by mouth daily.       Marland Kitchen NITROSTAT 0.4 MG SL tablet Place 0.4 mg under the tongue every 5 (five) minutes as needed.       . Omeprazole 20 MG TBEC Take 1 tablet by mouth 2 (two) times daily.       . ondansetron (ZOFRAN-ODT) 4 MG disintegrating tablet Take 4 mg by mouth every 6 (six) hours as needed.      . potassium chloride  (K-DUR) 10 MEQ tablet Take 10 mEq by mouth daily.       . sucralfate (CARAFATE) 1 G tablet Take 1 g by mouth 3 (three) times daily.       . traMADol (ULTRAM) 50 MG tablet Take 1 tablet (50 mg total) by mouth every 6 (six) hours as needed.  30 tablet  0  . warfarin (COUMADIN) 1 MG tablet Take 1 mg by mouth daily.        No current facility-administered medications on file prior to visit.    Allergies  Allergen Reactions  . Ezetimibe     REACTION: sever myalgias  . Ibuprofen   . Rosuvastatin     REACTION: leg cramps  . Simvastatin     REACTION: leg cramps    Past Medical History  Diagnosis Date  . History of colon cancer   . CAD (coronary artery disease)   . GERD (gastroesophageal reflux disease)   . Hyperlipidemia   . Hypertension   . Rheumatoid arthritis(714.0)   . Osteoarthritis   . Atrial fibrillation   . Stroke   . Pulmonary hypertension   . CHF (congestive heart failure)   . Chest pain  5/09 Chest pain--cath at [facility] shows patent stent and no sig obstructive disease  . SOB (shortness of breath)     Admitted with SOB 11/10-------------severe pulm HTN found on echo  . Anxiety     Past Surgical History  Procedure Laterality Date  . Appendectomy    . Tonsillectomy    . Carotid stent    . Partial colectomy  1989    colon  . Vaginal delivery      x1  . Cataract extraction  2007    left  . Total knee arthroplasty  11/09    right  . Ablation of dysrhythmic focus  11/11    Ablation for atrial flutter  . Cardiac catheterization      Cath severe MR good LV EF, mild CAD 05/2005    Family History  Problem Relation Age of Onset  . Heart disease Mother   . Heart disease Father   . Diabetes Son   . Cancer Paternal Aunt     colon cancer  . Cancer Brother     lung cancer    History   Social History  . Marital Status: Widowed    Spouse Name: N/A    Number of Children: 1  . Years of Education: N/A   Occupational History  . retiredTEFL teacher    Social  History Main Topics  . Smoking status: Never Smoker   . Smokeless tobacco: Never Used  . Alcohol Use: No  . Drug Use: No  . Sexual Activity: Not on file   Other Topics Concern  . Not on file   Social History Narrative   Granddaughter Vanessa Kick now has health care POA.    Would accept resuscitation but doesn't want any prolonged artifical respiration or machines.   Review of Systems Appetite is "gone"--- has to force herself to eat Has some trouble swallowing--feels tight in throat at times Takes the prilosec regularly and this controls the heartburn     Objective:   Physical Exam  Constitutional: She appears well-developed. No distress.  Neck: Normal range of motion. Neck supple. No thyromegaly present.  Cardiovascular: Normal rate and regular rhythm.  Exam reveals no gallop.   Murmur heard. Regular Grade 2/6 mitral systolic murmur  Pulmonary/Chest: Effort normal and breath sounds normal. No respiratory distress. She has no wheezes. She has no rales.  Musculoskeletal: She exhibits no edema and no tenderness.  Lymphadenopathy:    She has no cervical adenopathy.  Psychiatric: Her behavior is normal.  Mildly anxious Slightly pressured speech          Assessment & Plan:

## 2013-06-14 NOTE — Assessment & Plan Note (Signed)
Pain is an ongoing problem May do better with more support

## 2013-06-14 NOTE — Assessment & Plan Note (Signed)
BP Readings from Last 3 Encounters:  06/14/13 140/80  03/29/13 140/60  01/25/13 118/60   Good control Due for labs

## 2013-06-14 NOTE — Assessment & Plan Note (Signed)
Regular now On coumadin 

## 2013-06-14 NOTE — Patient Instructions (Signed)
Take 1/2 tablet today (11/10) and then Continue to take 5mg  daily and recheck in 4 weeks.

## 2013-06-14 NOTE — Assessment & Plan Note (Signed)
Sounds like her aides have not been making the best low salt choices Continues to use the diuretics May be best to go into assisted living now--with worsening status

## 2013-06-14 NOTE — Assessment & Plan Note (Signed)
Lab Results  Component Value Date   LDLCALC 105* 08/14/2007   Will recheck labs Not sure statin is good idea at her age

## 2013-06-17 ENCOUNTER — Other Ambulatory Visit: Payer: Self-pay | Admitting: *Deleted

## 2013-06-17 MED ORDER — TRAMADOL HCL 50 MG PO TABS: 50.0000 mg | ORAL_TABLET | Freq: Four times a day (QID) | ORAL | Status: DC | PRN

## 2013-06-17 NOTE — Telephone Encounter (Signed)
rx called into pharmacy

## 2013-06-17 NOTE — Telephone Encounter (Signed)
Okay #30 x 0 

## 2013-06-17 NOTE — Telephone Encounter (Signed)
Last filled 05/26/13 

## 2013-06-19 IMAGING — RF DG BARIUM SWALLOW
6 series · 14 of 18 positions shown · non-contrast
Comparison: none

REASON FOR EXAM: Difficulty swallowing
COMMENTS:

[Series 1: fluoro_barium 2fps_bw · 0.17mm/px · 3 of 21 frames shown (1 of 6)]
[frame 4/21]
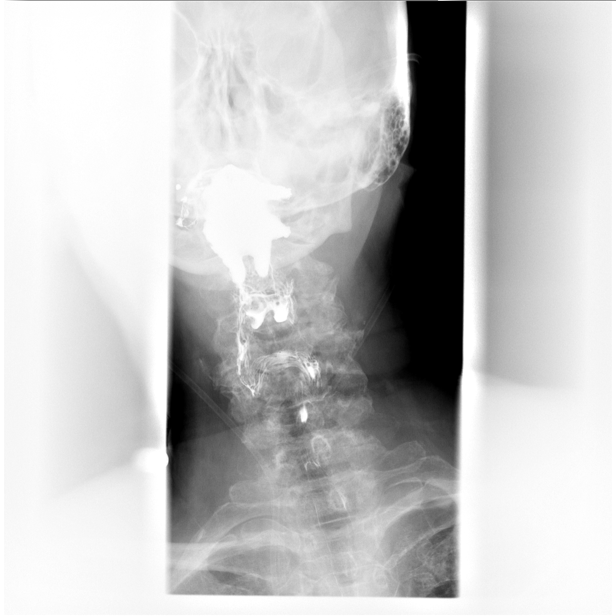
[frame 6/21]
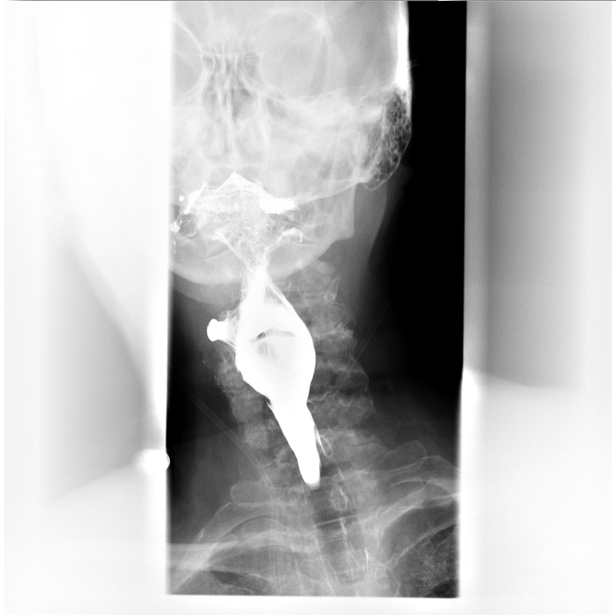
[frame 18/21]
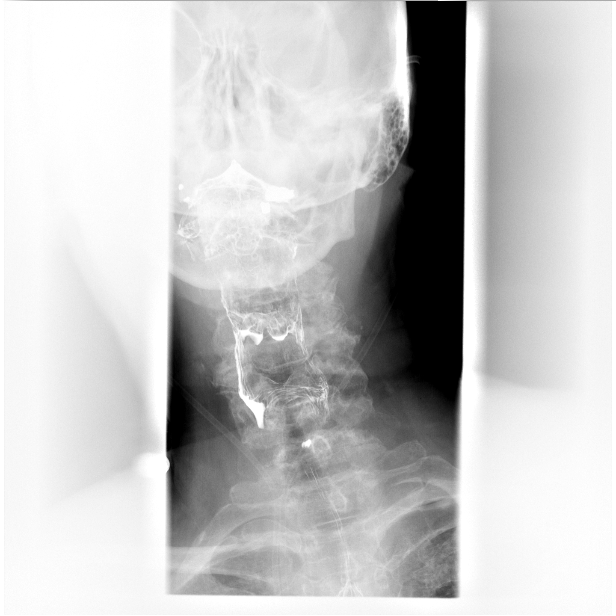

[Series 2: fluoro_barium 2fps_bw · 0.17mm/px · 3 of 21 frames shown (2 of 6)]
[frame 4/21]
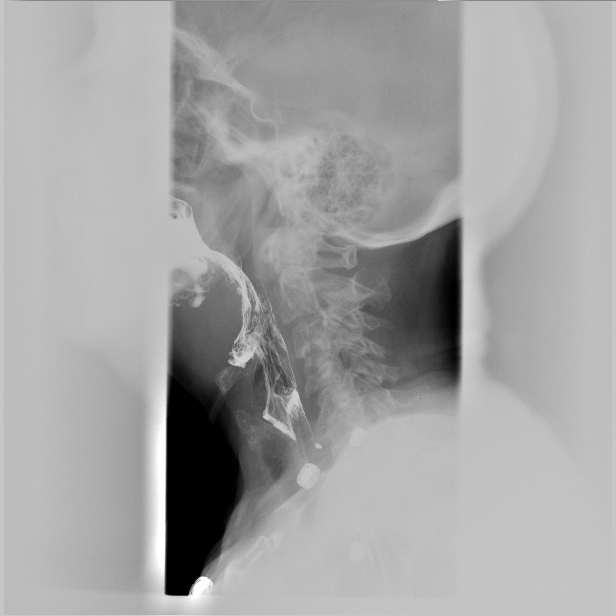
[frame 8/21]
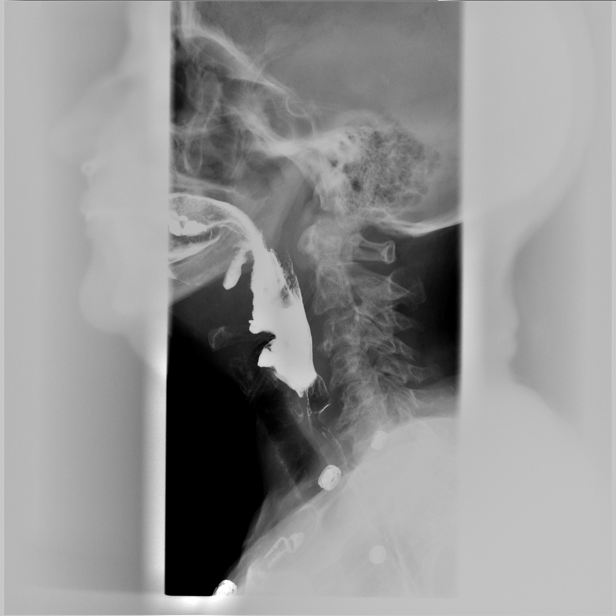
[frame 18/21]
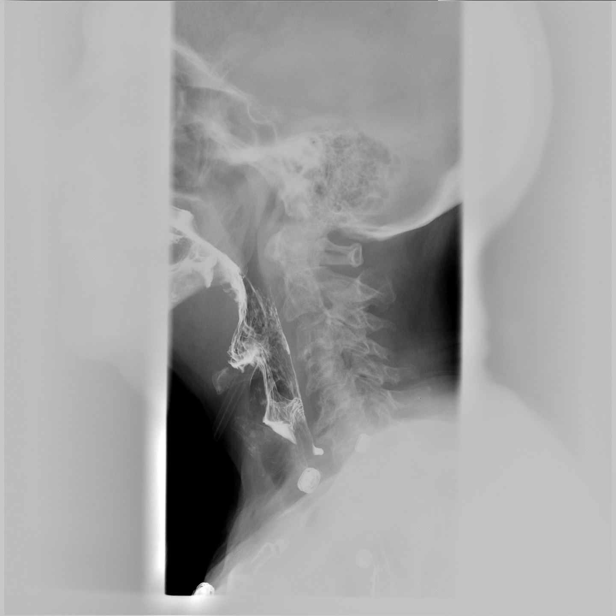

[Series 3: fluoro_barium 2fps_bw · 0.17mm/px · 2 of 2 frames shown (3 of 6)]
[frame 1/2]
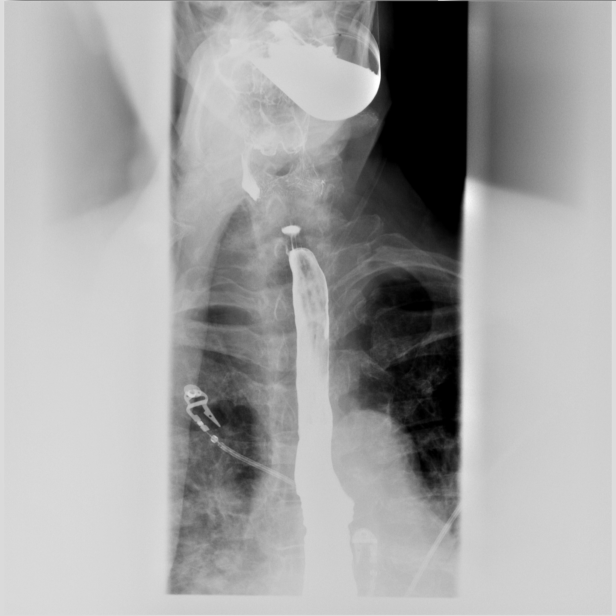
[frame 2/2]
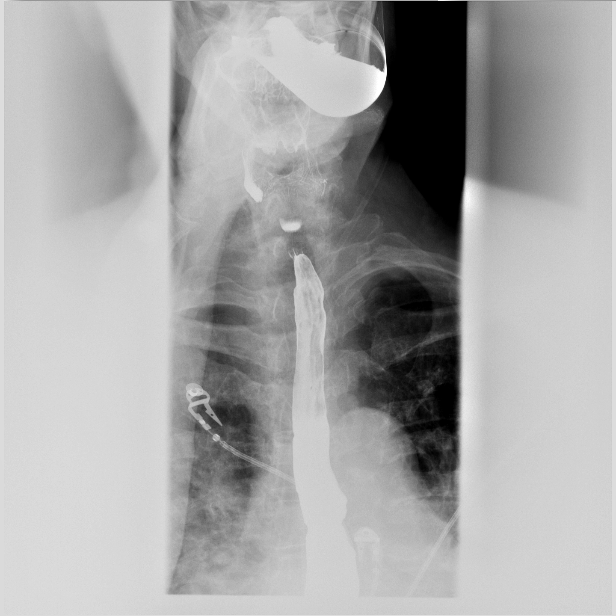

[Series 4: fluoro_barium 2fps_bw · 0.17mm/px · 3 of 25 frames shown (4 of 6)]
[frame 4/25]
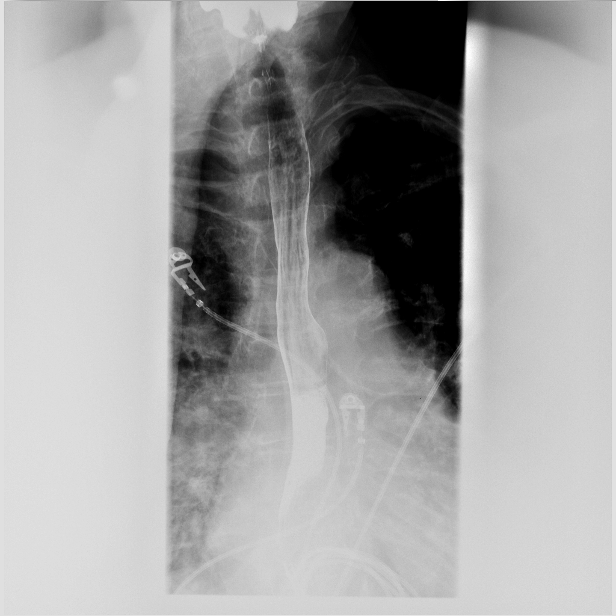
[frame 13/25]
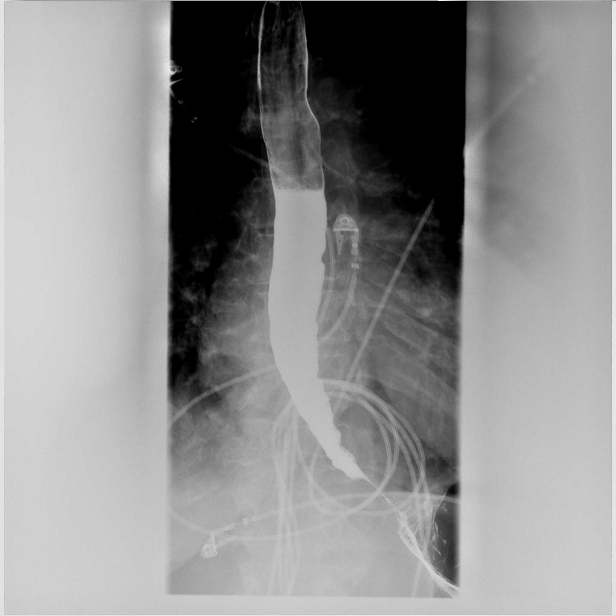
[frame 22/25]
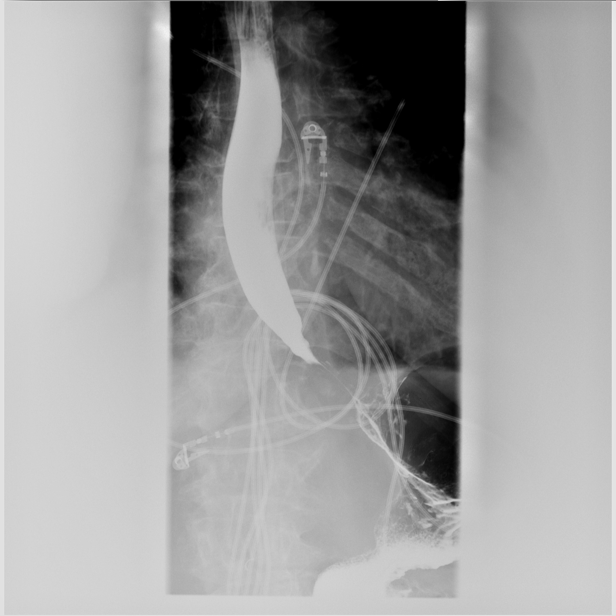

[Series 5: fluoro_barium 2fps_bw · 0.17mm/px · 1 of 2 frames shown (5 of 6)]
[frame 1/2]
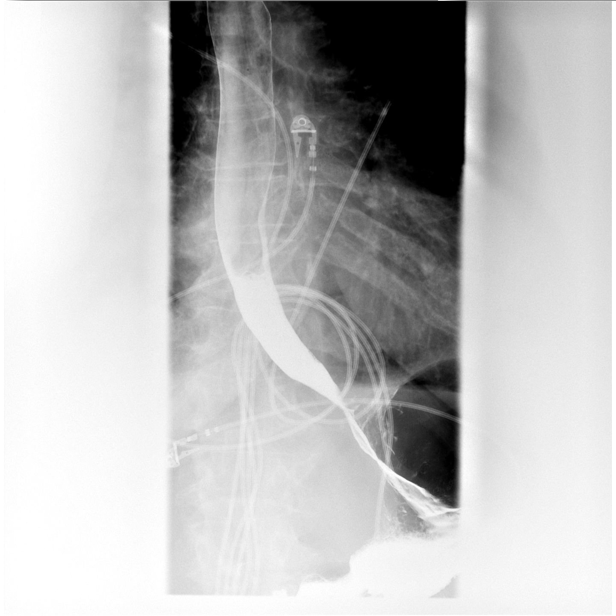

[Series 6: fluoro_barium 2fps_bw · 0.17mm/px · 2 of 2 frames shown (6 of 6)]
[frame 1/2]
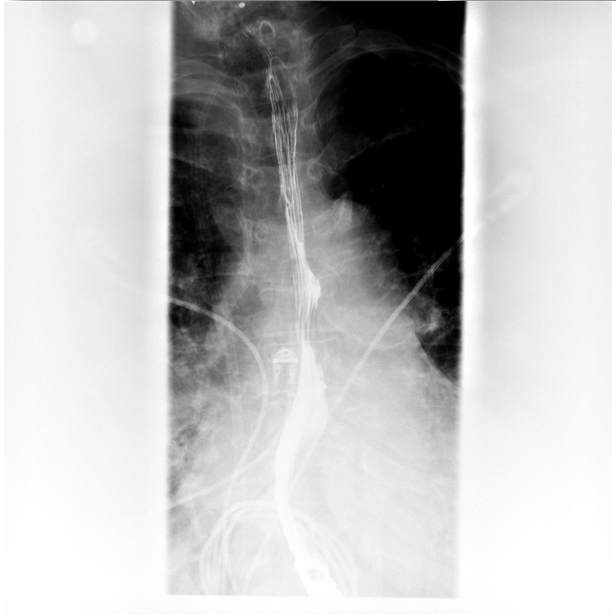
[frame 2/2]
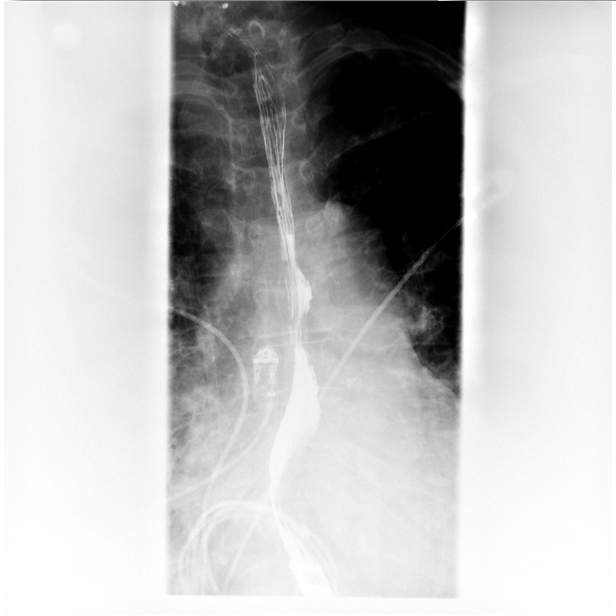

[14 of 18 positions shown; findings below may reference images not displayed]

PROCEDURE:     FL  - FL BARIUM SWALLOW  - May 01, 2012  [DATE]

RESULT:

Procedure: A barium swallow was performed with the patient in a standing
position. The patient could not tolerate RAO positioning and thus the study
was limited.

The patient was given oral barium and dynamic imaging of the cervical
esophagus was obtained. This was followed by the administration of
effervescent crystals with further evaluation of the remaining components of
the cervical esophagus. A 13 mm Barotab was also administered.
FINDINGS: A small diverticulum is identified within the cervical esophagus
at the level of C5 which may represent Avon Ghaly diverticulum. The
cervical esophagus is otherwise unremarkable. Limited evaluation of the
thoracic esophagus is unremarkable. There is appropriate relaxation of the
lower esophageal sphincter. A 13 mm Barotab was administered which traversed
the esophagus without complications. There is no evidence of obstruction nor
filling defects.
IMPRESSION: 1. Limited barium swallow as described above. A small diverticulum is
identified along the posterior aspect of the cervical esophagus,
questionable clinical significance without further abnormalities.

## 2013-06-21 ENCOUNTER — Encounter: Payer: Self-pay | Admitting: *Deleted

## 2013-06-21 ENCOUNTER — Other Ambulatory Visit: Payer: Self-pay | Admitting: *Deleted

## 2013-06-21 MED ORDER — POTASSIUM CHLORIDE ER 20 MEQ PO TBCR: 1.0000 | EXTENDED_RELEASE_TABLET | Freq: Two times a day (BID) | ORAL | Status: AC

## 2013-06-25 ENCOUNTER — Other Ambulatory Visit: Payer: Self-pay | Admitting: Internal Medicine

## 2013-06-25 DIAGNOSIS — R7989 Other specified abnormal findings of blood chemistry: Secondary | ICD-10-CM

## 2013-06-29 ENCOUNTER — Other Ambulatory Visit: Payer: Self-pay | Admitting: Internal Medicine

## 2013-06-29 DIAGNOSIS — R6889 Other general symptoms and signs: Secondary | ICD-10-CM

## 2013-06-29 DIAGNOSIS — R7989 Other specified abnormal findings of blood chemistry: Secondary | ICD-10-CM

## 2013-07-02 ENCOUNTER — Other Ambulatory Visit: Payer: Self-pay | Admitting: *Deleted

## 2013-07-02 NOTE — Telephone Encounter (Signed)
Okay #60 x 0 

## 2013-07-02 NOTE — Telephone Encounter (Signed)
Last filled 05/25/13 

## 2013-07-05 ENCOUNTER — Ambulatory Visit (INDEPENDENT_AMBULATORY_CARE_PROVIDER_SITE_OTHER): Payer: Medicare Other | Admitting: Family Medicine

## 2013-07-05 ENCOUNTER — Other Ambulatory Visit: Payer: Self-pay

## 2013-07-05 ENCOUNTER — Other Ambulatory Visit (INDEPENDENT_AMBULATORY_CARE_PROVIDER_SITE_OTHER): Payer: Medicare Other

## 2013-07-05 DIAGNOSIS — R7989 Other specified abnormal findings of blood chemistry: Secondary | ICD-10-CM

## 2013-07-05 DIAGNOSIS — R6889 Other general symptoms and signs: Secondary | ICD-10-CM

## 2013-07-05 DIAGNOSIS — I4891 Unspecified atrial fibrillation: Secondary | ICD-10-CM

## 2013-07-05 DIAGNOSIS — Z7901 Long term (current) use of anticoagulants: Secondary | ICD-10-CM

## 2013-07-05 DIAGNOSIS — Z5181 Encounter for therapeutic drug level monitoring: Secondary | ICD-10-CM

## 2013-07-05 LAB — BASIC METABOLIC PANEL
BUN: 18 mg/dL (ref 6–23)
CO2: 38 mEq/L — ABNORMAL HIGH (ref 19–32)
Chloride: 88 mEq/L — ABNORMAL LOW (ref 96–112)
GFR: 40.58 mL/min — ABNORMAL LOW (ref >60.00)
Glucose, Bld: 133 mg/dL — ABNORMAL HIGH (ref 70–99)
Potassium: 3.9 mEq/L (ref 3.5–5.1)
Sodium: 138 mEq/L (ref 135–145)

## 2013-07-05 LAB — T4, FREE: Free T4: 0.8 ng/dL (ref 0.60–1.60)

## 2013-07-05 LAB — POCT INR: INR: 3

## 2013-07-05 MED ORDER — TRAMADOL HCL 50 MG PO TABS: 50.0000 mg | ORAL_TABLET | Freq: Four times a day (QID) | ORAL | Status: AC | PRN

## 2013-07-05 MED ORDER — ALPRAZOLAM 0.25 MG PO TABS: 0.2500 mg | ORAL_TABLET | Freq: Two times a day (BID) | ORAL | Status: AC | PRN

## 2013-07-05 NOTE — Telephone Encounter (Signed)
rx called into pharmacy

## 2013-07-05 NOTE — Telephone Encounter (Signed)
Deborah Conway said family came by for update on refill; spoke with Shanda Bumps at [facility] and Medication phoned to [facility] pharmacy as instructed.

## 2013-07-05 NOTE — Telephone Encounter (Signed)
Okay #30 x 0 

## 2013-07-05 NOTE — Telephone Encounter (Signed)
Deborah Conway said family came by to ck on status of Tramadol refill to [facility]. Spoke with Shanda Bumps at [facility] and tramadol refill was faxed on 06/28/13; do not see where received refill request; Tramadol refill request to [facility] pharmacy.Please advise.

## 2013-07-05 NOTE — Patient Instructions (Signed)
Continue to take 5mg  daily and recheck in 4 weeks.

## 2013-07-08 ENCOUNTER — Encounter: Payer: Self-pay | Admitting: *Deleted

## 2013-07-16 ENCOUNTER — Inpatient Hospital Stay: Payer: Self-pay | Admitting: Internal Medicine

## 2013-07-16 ENCOUNTER — Encounter: Payer: Self-pay | Admitting: Cardiovascular Disease

## 2013-07-16 ENCOUNTER — Ambulatory Visit (INDEPENDENT_AMBULATORY_CARE_PROVIDER_SITE_OTHER): Payer: Medicare Other | Admitting: Cardiovascular Disease

## 2013-07-16 VITALS — BP 128/78 | HR 60 | Ht 68.0 in | Wt 215.0 lb

## 2013-07-16 DIAGNOSIS — R0602 Shortness of breath: Secondary | ICD-10-CM

## 2013-07-16 DIAGNOSIS — I10 Essential (primary) hypertension: Secondary | ICD-10-CM

## 2013-07-16 DIAGNOSIS — I4891 Unspecified atrial fibrillation: Secondary | ICD-10-CM

## 2013-07-16 DIAGNOSIS — I509 Heart failure, unspecified: Secondary | ICD-10-CM

## 2013-07-16 DIAGNOSIS — I272 Pulmonary hypertension, unspecified: Secondary | ICD-10-CM | POA: Insufficient documentation

## 2013-07-16 DIAGNOSIS — E785 Hyperlipidemia, unspecified: Secondary | ICD-10-CM

## 2013-07-16 DIAGNOSIS — I2789 Other specified pulmonary heart diseases: Secondary | ICD-10-CM

## 2013-07-16 DIAGNOSIS — I2510 Atherosclerotic heart disease of native coronary artery without angina pectoris: Secondary | ICD-10-CM

## 2013-07-16 DIAGNOSIS — I5032 Chronic diastolic (congestive) heart failure: Secondary | ICD-10-CM

## 2013-07-16 LAB — COMPREHENSIVE METABOLIC PANEL
Alkaline Phosphatase: 95 U/L
Anion Gap: 3 — ABNORMAL LOW (ref 7–16)
Bilirubin,Total: 0.6 mg/dL (ref 0.2–1.0)
Calcium, Total: 9.6 mg/dL (ref 8.5–10.1)
Chloride: 85 mmol/L — ABNORMAL LOW (ref 98–107)
Co2: 44 mmol/L (ref 21–32)
Creatinine: 1.28 mg/dL (ref 0.60–1.30)
EGFR (African American): 43 — ABNORMAL LOW
EGFR (Non-African Amer.): 37 — ABNORMAL LOW
Osmolality: 273 (ref 275–301)
Potassium: 3.5 mmol/L (ref 3.5–5.1)
Sodium: 132 mmol/L — ABNORMAL LOW (ref 136–145)

## 2013-07-16 LAB — URINALYSIS, COMPLETE
Bacteria: NONE SEEN
Glucose,UR: NEGATIVE mg/dL (ref 0–75)
Hyaline Cast: 21
Ketone: NEGATIVE
Ph: 5 (ref 4.5–8.0)
Protein: NEGATIVE
RBC,UR: 4 /HPF (ref 0–5)
Specific Gravity: 1.016 (ref 1.003–1.030)

## 2013-07-16 LAB — CK TOTAL AND CKMB (NOT AT ARMC): CK-MB: 0.7 ng/mL (ref 0.5–3.6)

## 2013-07-16 LAB — TROPONIN I: Troponin-I: <0.02 ng/mL

## 2013-07-16 LAB — CBC
MCH: 29.8 pg (ref 26.0–34.0)
MCHC: 31.6 g/dL — ABNORMAL LOW (ref 32.0–36.0)
Platelet: 96 10*3/uL — ABNORMAL LOW (ref 150–440)
RBC: 3.28 10*6/uL — ABNORMAL LOW (ref 3.80–5.20)
RDW: 16.8 % — ABNORMAL HIGH (ref 11.5–14.5)
WBC: 4.4 10*3/uL (ref 3.6–11.0)

## 2013-07-16 NOTE — Assessment & Plan Note (Addendum)
Prior echocardiogram at [facility] suggesting severe pulmonary hypertension. Likely exacerbation on today's visit. Will need aggressive diuresis with close monitoring of her renal function. Inadequate diuresis on Lasix 40 mg twice a day with metolazone every morning over the past several weeks. Prior to that was stable.

## 2013-07-16 NOTE — Assessment & Plan Note (Signed)
Blood pressure is well controlled on today's visit. No changes made to the medications. 

## 2013-07-16 NOTE — Assessment & Plan Note (Addendum)
Has not tolerated a statin well in the past. Cholesterol above goal

## 2013-07-16 NOTE — Assessment & Plan Note (Signed)
Significant respiratory distress on arrival to the office today. 10 pound weight gain. Failure to thrive at home, worsening shortness of breath, clinical exam consistent with acute on chronic diastolic CHF. Patient reports she is unable to cope at home. Family is in agreement with this. We have recommended she have evaluation in the emergency room

## 2013-07-16 NOTE — Progress Notes (Signed)
Patient ID: Deborah Conway, female    DOB: May 24, 1922, 79 y.o.   MRN: [id number]  HPI Comments: 79 year old woman with a history of coronary artery disease, PCI in the past with catheterization in May 2009 showing stable disease, paroxysmal atrial fibrillation, severe pulmonary hypertension by echocardiography done at [facility], chronic diastolic CHF , long smoking history, not on inhalers, chronic shortness of breath who is on chronic oxygen 4 L nasal cannula,  who presents for routine visit. She is a patient of Dr. Alphonsus Sias.  On today's visit, she reports one month of worsening shortness of breath, weight gain, leg edema. She has been taking Lasix twice a day, sometimes 3 times a day with no relief of her symptoms. She is having a difficult time walking around her house Despite the assistance of aides. No recent falls, but she feels very unsteady on her feet. She gives out and difficulty doing her ADLs secondary to shortness of breath and weakness. Family presents with her today and reports that she is not able to cope at home secondary to worsening symptoms.  Weight is up 10 pounds from her prior clinic visit several months ago. Last clinic visit was in June 2014. No recent phone calls to our office for weight gain or CHF symptoms. She has a thick cough that is white. Worse when supine.  She called EMTs last night for PND/orthopnea. She declined transfer to the hospital as she told them she had a doctor's appointment today.   she lives alone .    she does have a history of Previous falls at home.  Last bad fall in June 2014.   she was  sitting on her bed, reached out for her cane and fell forward. She hit the right eye, right lateral upper thigh. She developed a huge hematoma on the thigh, large region of ecchymosis around her eye.    Echocardiogram from November 2011 shows mild aortic valve regurgitation, ejection fraction 35-40%.  EKG shows normal sinus rhythm with Rate of 59 beats per  minute, poor R wave progression through the anterior precordial leads,  intraventricular conduction delay Multiple EKGs done in the office secondary to motion artifact, frequent ectopy noted    Outpatient Encounter Prescriptions as of 07/16/2013  Medication Sig  . albuterol (PROVENTIL HFA;VENTOLIN HFA) 108 (90 BASE) MCG/ACT inhaler Inhale 2 puffs into the lungs 3 (three) times daily.  Marland Kitchen ALPRAZolam (XANAX) 0.25 MG tablet Take 1 tablet (0.25 mg total) by mouth 2 (two) times daily as needed.  Marland Kitchen amiodarone (PACERONE) 200 MG tablet Take 200 mg by mouth daily.   Marland Kitchen aspirin 81 MG tablet Take 1 tablet (81 mg total) by mouth daily.  . fish oil-omega-3 fatty acids 1000 MG capsule Take 2 g by mouth daily.    . furosemide (LASIX) 40 MG tablet Take 40 mg by mouth 2 (two) times daily.  Marland Kitchen HYDROcodone-acetaminophen (NORCO/VICODIN) 5-325 MG per tablet Take 1 tablet by mouth 3 (three) times daily as needed.  . metolazone (ZAROXOLYN) 2.5 MG tablet Take 2.5 mg by mouth daily.   Marland Kitchen NITROSTAT 0.4 MG SL tablet Place 0.4 mg under the tongue every 5 (five) minutes as needed.   . Omeprazole 20 MG TBEC Take 1 tablet by mouth daily.   . Potassium Chloride ER 20 MEQ TBCR Take 1 tablet by mouth 2 (two) times daily.  . sucralfate (CARAFATE) 1 G tablet Take 1 g by mouth 3 (three) times daily.   . traMADol (ULTRAM) 50 MG  tablet Take 1 tablet (50 mg total) by mouth every 6 (six) hours as needed.  . warfarin (COUMADIN) 1 MG tablet Take 1 mg by mouth daily.   . [DISCONTINUED] albuterol (PROVENTIL HFA;VENTOLIN HFA) 108 (90 BASE) MCG/ACT inhaler Inhale 2 puffs into the lungs 4 (four) times daily.  . [DISCONTINUED] furosemide (LASIX) 40 MG tablet Take 0.5-1 tablets (20-40 mg total) by mouth 2 (two) times daily.  . [DISCONTINUED] ondansetron (ZOFRAN-ODT) 4 MG disintegrating tablet Take 4 mg by mouth every 6 (six) hours as needed.    Review of Systems  Constitutional: Positive for fatigue.  HENT: Negative.   Eyes: Negative.    Respiratory: Positive for cough and shortness of breath.   Cardiovascular: Negative.   Gastrointestinal: Negative.   Endocrine: Negative.   Musculoskeletal: Positive for back pain and gait problem.  Skin: Negative.   Allergic/Immunologic: Negative.   Neurological: Positive for weakness.  Hematological: Negative.   Psychiatric/Behavioral: Negative.   All other systems reviewed and are negative.   BP 128/78  Pulse 60  Ht 5\' 8"  (1.727 m)  Wt 215 lb (97.523 kg)  BMI 32.70 kg/m2  SpO2 93%  Physical Exam  Nursing note and vitals reviewed. Constitutional: She is oriented to person, place, and time. She appears well-developed and well-nourished.  On initial arrival, was in moderate respiratory distress. Placed on high flow oxygen with improvement of her symptoms.  HENT:  Head: Normocephalic.  Nose: Nose normal.  Mouth/Throat: Oropharynx is clear and moist.  Eyes: Conjunctivae are normal. Pupils are equal, round, and reactive to light.  Neck: Normal range of motion. Neck supple. No JVD present.  Cardiovascular: Normal rate, regular rhythm, S1 normal, S2 normal and intact distal pulses.  Exam reveals no gallop and no friction rub.   Murmur heard.  Crescendo systolic murmur is present with a grade of 2/6  1+ pitting edema around her ankles, lower extremities  Pulmonary/Chest: She is in respiratory distress. She has decreased breath sounds. She has wheezes. She has rales. She exhibits no tenderness.  Abdominal: Soft. Bowel sounds are normal. She exhibits no distension. There is no tenderness.  Musculoskeletal: Normal range of motion. She exhibits edema. She exhibits no tenderness.  Lymphadenopathy:    She has no cervical adenopathy.  Neurological: She is alert and oriented to person, place, and time. Coordination normal.  Skin: Skin is warm and dry. No rash noted. No erythema.  Psychiatric: She has a normal mood and affect. Her behavior is normal. Judgment and thought content normal.     Assessment and Plan

## 2013-07-16 NOTE — Assessment & Plan Note (Signed)
Challenging EKGs secondary to ectopy and motion artifact. Appears to be maintaining normal sinus rhythm with ectopy

## 2013-07-16 NOTE — Assessment & Plan Note (Signed)
Unable to exclude ischemic heart disease as a cause of her presentation today. We'll need to check cardiac enzymes, repeat echocardiogram.

## 2013-07-16 NOTE — Patient Instructions (Signed)
Given your increasing shortness of breath, weight gain, We suggest you proceed to the emergency room for further evaluation. You will  probably need several days in the hospital for diuresis to improve your shortness of breath

## 2013-07-17 DIAGNOSIS — I4891 Unspecified atrial fibrillation: Secondary | ICD-10-CM

## 2013-07-17 DIAGNOSIS — I5023 Acute on chronic systolic (congestive) heart failure: Secondary | ICD-10-CM

## 2013-07-17 DIAGNOSIS — I359 Nonrheumatic aortic valve disorder, unspecified: Secondary | ICD-10-CM

## 2013-07-17 LAB — PROTIME-INR: Prothrombin Time: 28.4 secs — ABNORMAL HIGH (ref 11.5–14.7)

## 2013-07-17 LAB — BASIC METABOLIC PANEL
Chloride: 85 mmol/L — ABNORMAL LOW (ref 98–107)
Co2: >45 mmol/L (ref 21–32)
Creatinine: 1.29 mg/dL (ref 0.60–1.30)

## 2013-07-17 LAB — LIPID PANEL
HDL Cholesterol: 62 mg/dL — ABNORMAL HIGH (ref 40–60)
Triglycerides: 73 mg/dL (ref 0–200)
VLDL Cholesterol, Calc: 15 mg/dL (ref 5–40)

## 2013-07-17 LAB — TROPONIN I: Troponin-I: 0.02 ng/mL

## 2013-07-18 LAB — BASIC METABOLIC PANEL
BUN: 35 mg/dL — ABNORMAL HIGH (ref 7–18)
Calcium, Total: 9.3 mg/dL (ref 8.5–10.1)
Chloride: 85 mmol/L — ABNORMAL LOW (ref 98–107)
Co2: 44 mmol/L (ref 21–32)
EGFR (African American): 41 — ABNORMAL LOW
EGFR (Non-African Amer.): 35 — ABNORMAL LOW
Glucose: 104 mg/dL — ABNORMAL HIGH (ref 65–99)
Sodium: 132 mmol/L — ABNORMAL LOW (ref 136–145)

## 2013-07-19 ENCOUNTER — Ambulatory Visit: Payer: Medicare Other

## 2013-07-19 DIAGNOSIS — R1310 Dysphagia, unspecified: Secondary | ICD-10-CM

## 2013-07-19 LAB — BASIC METABOLIC PANEL
Anion Gap: 3 — ABNORMAL LOW (ref 7–16)
Chloride: 86 mmol/L — ABNORMAL LOW (ref 98–107)
Creatinine: 1.52 mg/dL — ABNORMAL HIGH (ref 0.60–1.30)
EGFR (African American): 35 — ABNORMAL LOW
Osmolality: 277 (ref 275–301)
Potassium: 4.6 mmol/L (ref 3.5–5.1)
Sodium: 132 mmol/L — ABNORMAL LOW (ref 136–145)

## 2013-07-19 LAB — PROTIME-INR: INR: 2.8

## 2013-07-20 LAB — BASIC METABOLIC PANEL
Anion Gap: 3 — ABNORMAL LOW (ref 7–16)
BUN: 53 mg/dL — ABNORMAL HIGH (ref 7–18)
Calcium, Total: 9.3 mg/dL (ref 8.5–10.1)
Co2: 42 mmol/L (ref 21–32)
Creatinine: 1.72 mg/dL — ABNORMAL HIGH (ref 0.60–1.30)
EGFR (African American): 30 — ABNORMAL LOW
EGFR (Non-African Amer.): 26 — ABNORMAL LOW
Glucose: 103 mg/dL — ABNORMAL HIGH (ref 65–99)
Sodium: 129 mmol/L — ABNORMAL LOW (ref 136–145)

## 2013-07-21 ENCOUNTER — Ambulatory Visit: Payer: Self-pay | Admitting: Internal Medicine

## 2013-07-21 LAB — BASIC METABOLIC PANEL
Anion Gap: 1 — ABNORMAL LOW (ref 7–16)
Chloride: 84 mmol/L — ABNORMAL LOW (ref 98–107)
Co2: 42 mmol/L (ref 21–32)
Creatinine: 2.83 mg/dL — ABNORMAL HIGH (ref 0.60–1.30)
EGFR (African American): 16 — ABNORMAL LOW
Glucose: 176 mg/dL — ABNORMAL HIGH (ref 65–99)
Osmolality: 277 (ref 275–301)
Potassium: 4.8 mmol/L (ref 3.5–5.1)
Potassium: 5.4 mmol/L — ABNORMAL HIGH (ref 3.5–5.1)
Sodium: 127 mmol/L — ABNORMAL LOW (ref 136–145)

## 2013-07-21 LAB — PROTIME-INR
INR: 3
Prothrombin Time: 30.3 secs — ABNORMAL HIGH (ref 11.5–14.7)

## 2013-07-22 DIAGNOSIS — E875 Hyperkalemia: Secondary | ICD-10-CM

## 2013-07-22 LAB — BASIC METABOLIC PANEL
BUN: 83 mg/dL — ABNORMAL HIGH (ref 7–18)
Chloride: 82 mmol/L — ABNORMAL LOW (ref 98–107)
Co2: 39 mmol/L — ABNORMAL HIGH (ref 21–32)
Osmolality: 277 (ref 275–301)
Sodium: 124 mmol/L — ABNORMAL LOW (ref 136–145)

## 2013-07-22 LAB — PROTIME-INR: Prothrombin Time: 34.1 secs — ABNORMAL HIGH (ref 11.5–14.7)

## 2013-07-26 ENCOUNTER — Telehealth: Payer: Self-pay | Admitting: Internal Medicine

## 2013-07-26 NOTE — Telephone Encounter (Signed)
Form on your desk  

## 2013-07-26 NOTE — Telephone Encounter (Signed)
I had heard of her death  I will handle the forms and give her a call

## 2013-07-26 NOTE — Telephone Encounter (Signed)
Pt's daughter came in this morning and dropped of some FMLA paper work for herself to be filled out if possible since her [employer] passing. Put paperwork on your desk.

## 2013-07-27 IMAGING — CR DG CHEST 1V PORT
1 series · 2 of 2 positions shown · non-contrast
Comparison: none

REASON FOR EXAM: cough
COMMENTS:

[Series 1: ap · 0.17mm/px · 2 of 2 slices shown]
[im 1/2]
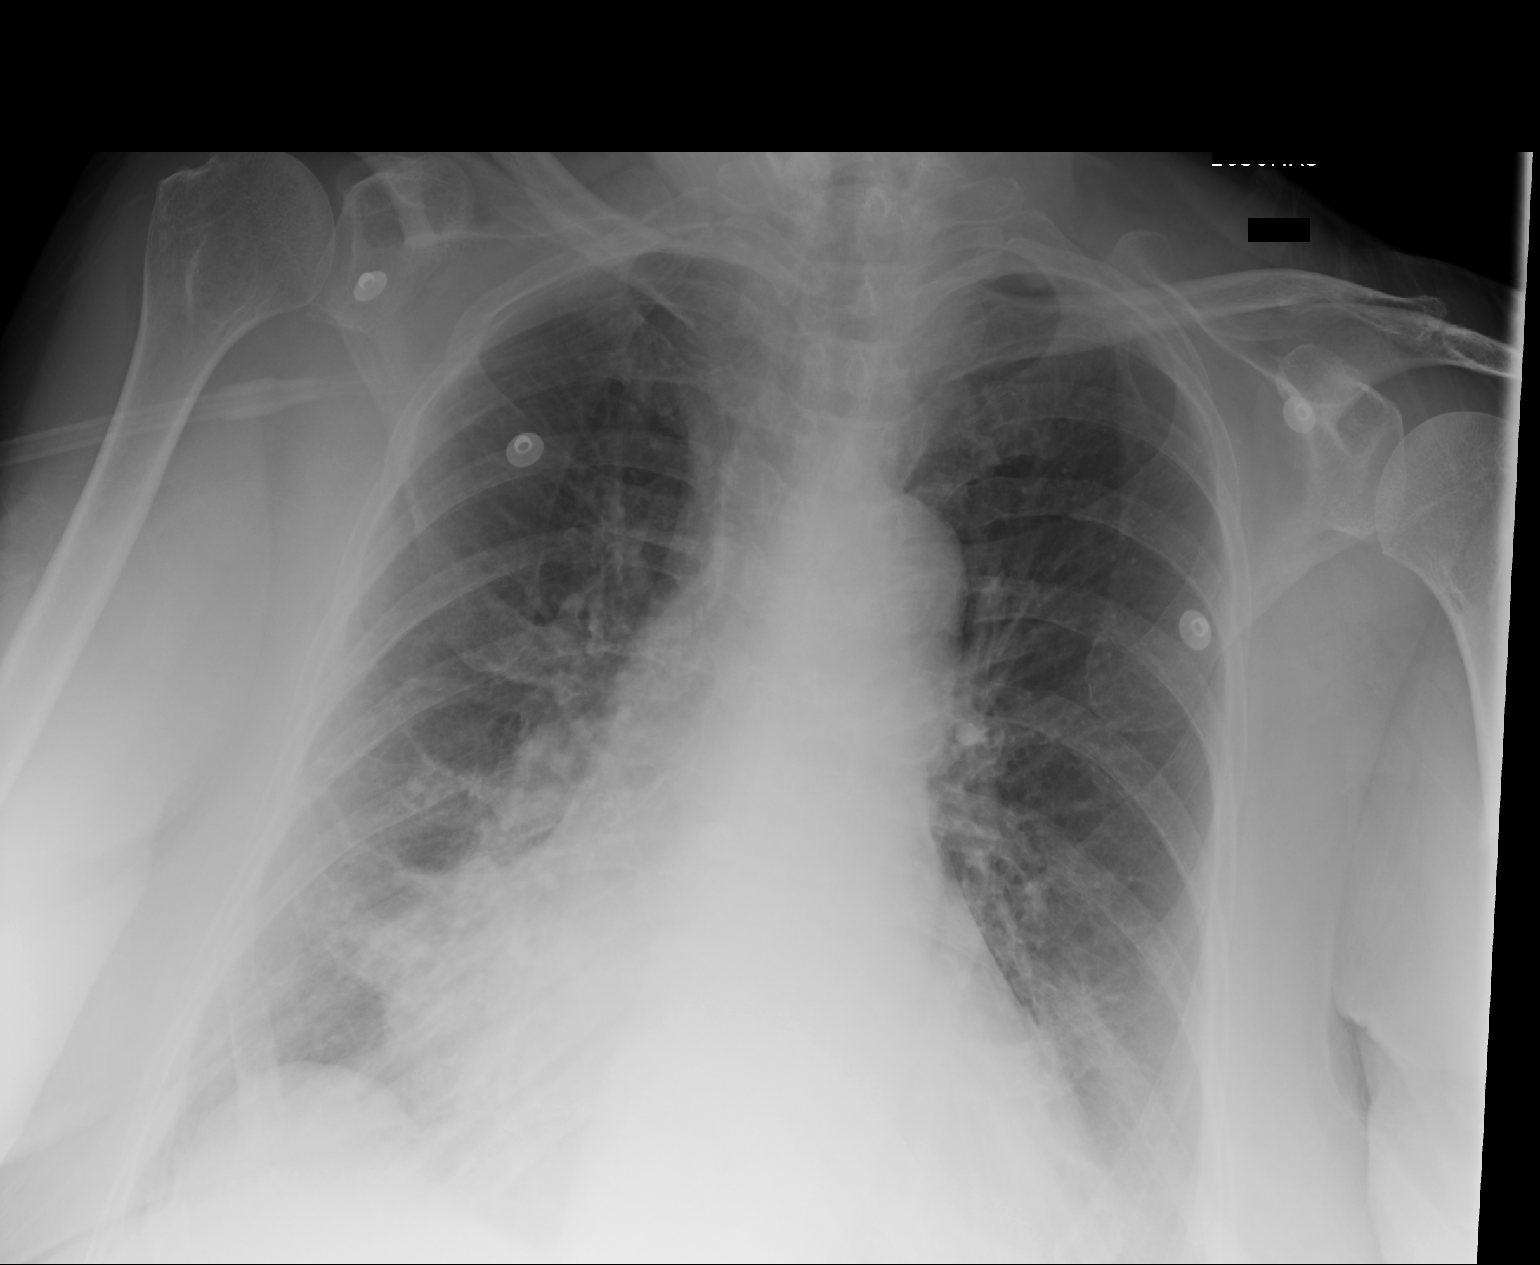
[im 2/2]
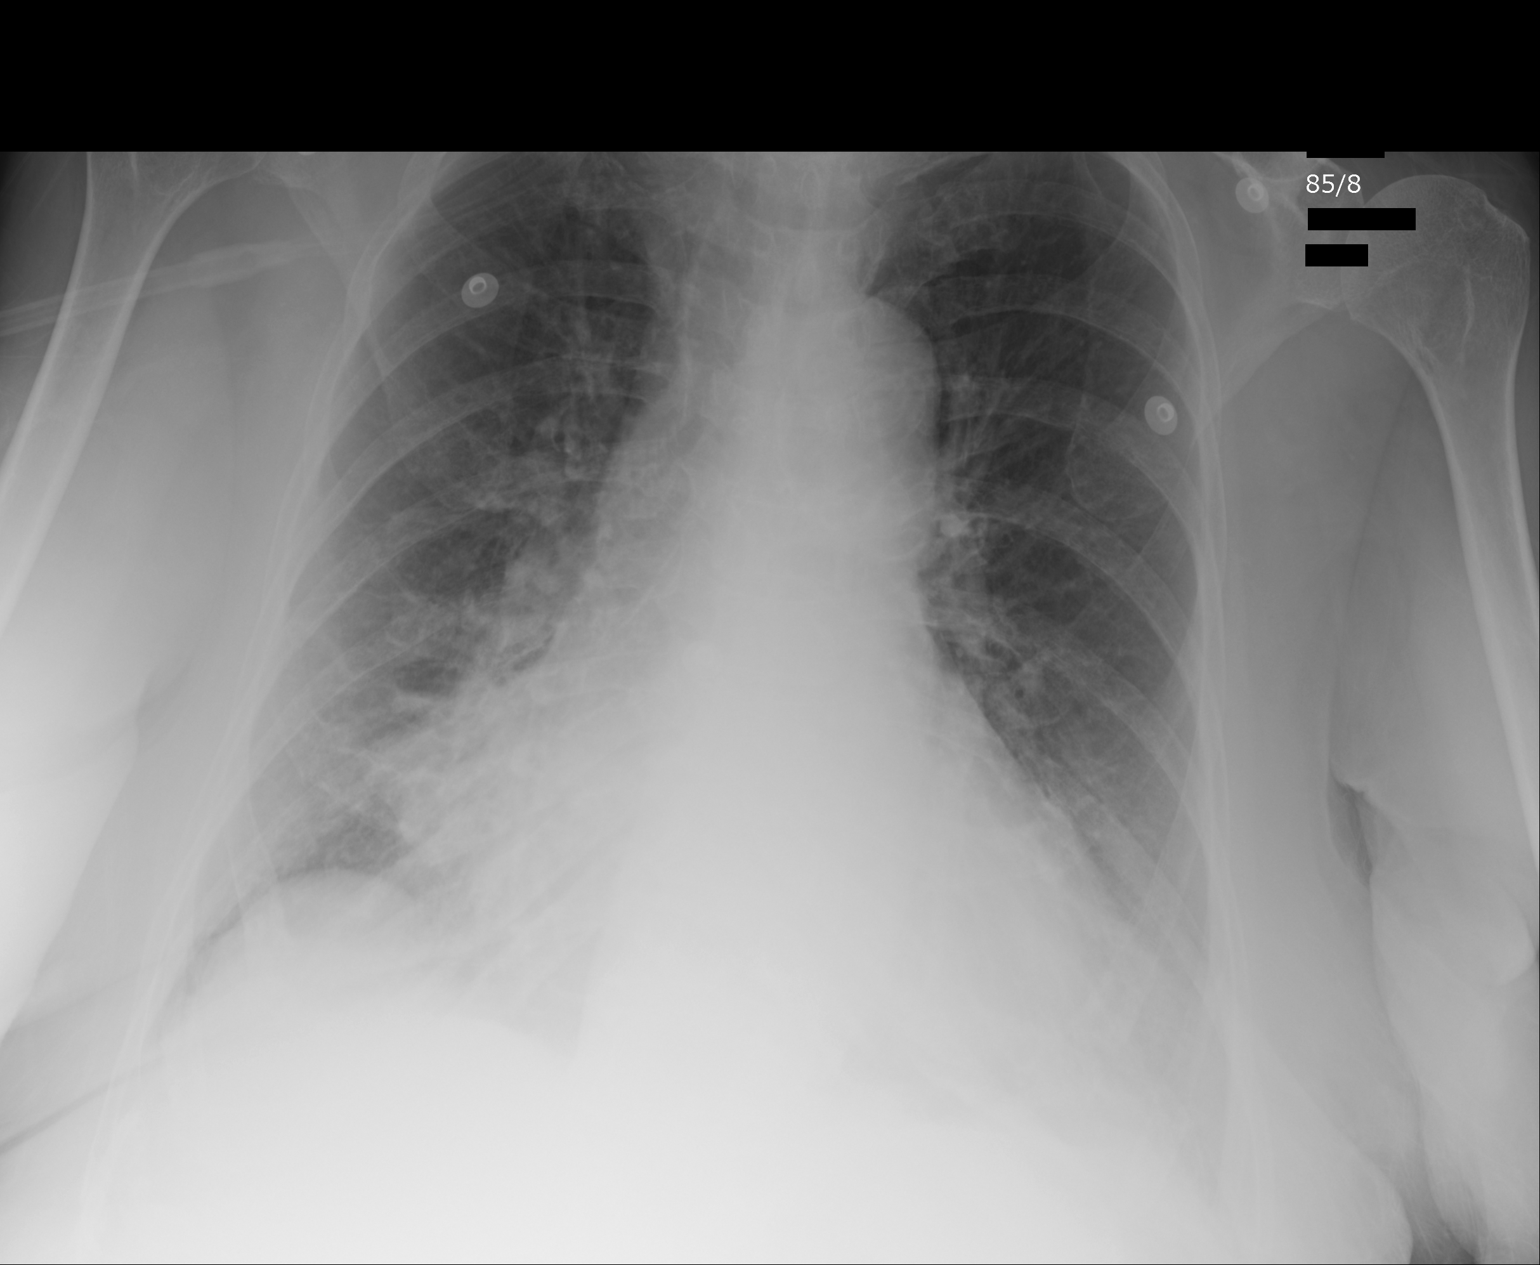

[2 of 2 positions shown; findings below may reference images not displayed]

PROCEDURE:     DXR - DXR PORTABLE CHEST SINGLE VIEW  - June 08, 2012  [DATE]

RESULT:     Comparison is made to the study 02 May, 2012.

The lungs are reasonably well inflated. The interstitial markings are
increased. The left hemidiaphragm is obscured and there is partial
obscuration of the right hemidiaphragm. The central pulmonary vascularity is
prominent. The cardiac silhouette is enlarged.
IMPRESSION: The findings are consistent with pulmonary interstitial
edema likely of cardiac cause. There may be basilar atelectasis as well. A
followup PA and lateral chest x-ray would be of value when the patient can
tolerate the procedure.

[REDACTED]

## 2013-08-05 ENCOUNTER — Ambulatory Visit: Payer: Self-pay | Admitting: Internal Medicine

## 2013-08-05 DEATH — deceased

## 2013-08-09 ENCOUNTER — Ambulatory Visit: Payer: Medicare Other

## 2014-11-22 NOTE — Consult Note (Signed)
Chief Complaint:   Subjective/Chief Complaint Pt seen. Please see Deborah Conway's notes.Pt has signif heart disease.   VITAL SIGNS/ANCILLARY NOTES: **Vital Signs.:   27-Sep-13 04:31   Vital Signs Type Routine   Temperature Temperature (F) 97.4   Celsius 36.3   Temperature Source Oral   Pulse Pulse 50   Respirations Respirations 20   Systolic BP Systolic BP 125   Diastolic BP (mmHg) Diastolic BP (mmHg) 68   Mean BP 87   Pulse Ox % Pulse Ox % 88   Pulse Ox Activity Level  At rest   Oxygen Delivery 1L   Brief Assessment:   Cardiac Regular    Gastrointestinal Normal   Lab Results: Routine Coag:  27-Sep-13 05:19    Prothrombin  31.3   INR 3.0 (INR reference interval applies to patients on anticoagulant therapy. A single INR therapeutic range for coumarins is not optimal for all indications; however, the suggested range for most indications is 2.0 - 3.0. Exceptions to the INR Reference Range may include: Prosthetic heart valves, acute myocardial infarction, prevention of myocardial infarction, and combinations of aspirin and anticoagulant. The need for a higher or lower target INR must be assessed individually. Reference: The Pharmacology and Management of the Vitamin K  antagonists: the seventh ACCP Conference on Antithrombotic and Thrombolytic Therapy. Chest.2004 Sept:126 (3suppl): [id number]. A HCT value >55% may artifactually increase the PT.  In one study,  the increase was an average of 25%. Reference:  "Effect on Routine and Special Coagulation Testing Values of Citrate Anticoagulant Adjustment in Patients with High HCT Values." American Journal of Clinical Pathology 2006;126:400-405.)   Radiology Results: Cardiology:    25-Sep-13 18:00, Echo Doppler   Echo Doppler    Interpretation Summary    Left ventricular systolic function is normal. Ejection Fraction =   >55%. The right ventricular systolic function is normal. The left   atrium is mildly dilated. There is  moderate mitral regurgitation.   There is mild to moderate tricuspid regurgitation. Right ventricular   systolic pressure is elevated at >29mmHg. Elevated RVSP consistent   with severe pulmonary HTN    PatientHeight: 173 cm    PatientWeight: 96 kg    SystolicPressure: 124 mmHg    DiastolicPressure:    BSA: 2.1 m2    Procedure:    A two-dimensional transthoracic echocardiogram with color flow and   Doppler was performed.    The study was completed  bedside.    Left Ventricle    Ejection Fraction = >55%.    The transmitral spectral Doppler flow pattern is normal for age.    The left ventricular wall motion is normal.    The left ventricle is normal in size.    There is normal left ventricular wall thickness.    Left ventricular systolic function is normal.    Right Ventricle    The rightventricle is mildly dilated.    The right ventricular systolic function is normal.    Atria    The left atrium is mildly dilated.    The right atrium is mildly dilated.    There is no Doppler evidence for an atrial septal defect.    Mitral Valve    The mitral valve leaflets appear thickened, but open well.    Calcified mitral apparatus.    There is no mitral valve stenosis.    There is moderate mitral regurgitation.    Tricuspid Valve    The tricuspid valve is not well visualized, but is grossly normal.  There is mild to moderate tricuspid regurgitation.    Right ventricular systolic pressure is elevated at >5460mmHg.    Aortic Valve    The aortic valve opens well.    No hemodynamically significant valvular aortic stenosis.    Mild aortic regurgitation.    Pulmonic Valve    The pulmonic valve leaflets are thin and pliable; valve motion is   normal.    Mild pulmonic valvular regurgitation.    Great Vessels    The aortic root is normal size.    The pulmonary is not well visualized.    Pericardium/Pleural    There is no pleural effusion.    No pericardial  effusion.    MMode 2D Measurements and Calculations    IVSd: 1.3 cm    LVIDd: 5.7 cm    LVIDs: 3.5 cm    LVPWd: 1.2 cm    FS: 39 %    EF(Teich): 69 %    Ao root diam: 3.6 cm    ACS: 1.5cm    LA dimension: 4.9 cm    LVOT diam: 2.0 cm    Doppler Measurements and Calculations    MV E point: 150 cm/sec    MV V2 max: 162 cm/sec    MV max PG: 10 mmHg    MV V2 mean: 74 cm/sec    MV mean PG: 3.0 mmHg    MV V2 VTI: 47 cm    MV P1/2t max vel: 180 cm/sec    MV P1/2t: 86 msec    MVA(P1/2t): 2.6 cm2    MV dec slope: 613 cm/sec2    MV dec time: 0.22 sec    Ao V2 max: 180 cm/sec    Ao max PG: 13 mmHg    Ao V2 mean: 94 cm/sec    Ao mean PG: 4.6 mmHg    Ao V2 VTI: 38 cm    AVA(I,D): 2.0 cm2    AVA(V,D): 2.0 cm2    AI max vel: 401 cm/sec    AI max PG: 64 mmHg    AI dec slope: 271 cm/sec2    AI P1/2t: 434 msec    LV max PG: 5.0 mmHg    LV mean PG: 1.8 mmHg    LV V1 max: 113 cm/sec    LV V1 mean: 58 cm/sec    LV V1 VTI: 25 cm    MR max vel: 522 cm/sec    MR max PG: 108 mmHg    MR mean vel: 381 cm/sec    MR mean PG: 66 mmHg    MR VTI: 169 cm    SV(LVOT): 77 ml    PA V2 max: 102 cm/sec    PA max PG: 4.0 mmHg    PA acc time: 0.07 sec    PI end-d vel: 111 cm/sec    TR Max vel: 497 cm/sec    TRMax PG: 97 mmHg    RVSP: 102 mmHg    RAP systole: 5.0 mmHg    PA pr(Accel): 47 mmHg    Reading Physician: Julien NordmannGollan, Timothy   Sonographer: Andi HenceSabir, Rizwan  Interpreting Physician:  Julien Nordmannimothy Gollan,  electronically signed on   04-30-2012 08:21:10  Requesting Physician: Julien NordmannGollan, Timothy   Assessment/Plan:  Assessment/Plan:   Assessment Pt with reflux and dysphagia. Prob esophageal dysmotility.    Plan Pt not stable for EGD at this time. INR also too high. Barium swallow has been ordered. Should be able to tolerate this. This will tell us if stricture is present.  Electronic Signatures: Lutricia Feil (MD)  (Signed 27-Sep-13 08:02)  Authored: Chief Complaint, VITAL SIGNS/ANCILLARY NOTES, Brief  Assessment, Lab Results, Radiology Results, Assessment/Plan   Last Updated: 27-Sep-13 08:02 by Lutricia Feil (MD)

## 2014-11-22 NOTE — H&P (Signed)
PATIENT NAME:  Deborah Conway, Deborah Conway MR#:  [id number] DATE OF BIRTH:  1921-10-09  DATE OF ADMISSION:  06/08/2012  PRIMARY CARE PHYSICIAN: Dr. Alphonsus SiasLetvak   CHIEF COMPLAINT: Syncope.   HISTORY OF PRESENT ILLNESS: Patient is an 81 year old female with history of hypertension, hyperlipidemia, history of diastolic heart failure on Lasix, metolazone and lisinopril recently discharged from [facility] on Friday came in because patient had a syncopal episode. Patient was coming out of the bathroom and her legs gave away. Patient sat down to the floor. Patient remembered landing on the right hip. Patient felt dizzy. She called the son and the son came. Patient was responding very slowly and he called the ambulance. Patient says that she had chest pain last night on the left side from 9:00 p.m. to 9:30 which resolved all on its own. This morning when she went to bathroom she had a syncopal episode with weakness in the hands and legs. No slurred speech. No chest pain today. No headache. Patient had dizziness. Blood pressure in the ER 133/51, pulse 60, but found to have hyponatremia with sodium 125, acute renal failure, bun 28, creatinine 1.92 which were normal on 09/30. Patient has been getting Lasix according to granddaughter who fixes her medicine, patient takes only 20 mg p.o. b.i.Conway. but the medication from discharge medication list 40 p.o. b.i.Conway. She is also on metolazone. No recent changes in the medicines have been done.   PAST MEDICAL HISTORY: 1. History of recent admission from 09/24 to 09/30 and discharged to [facility] at that time. 2. History of diastolic heart failure.  3. History of chronic atrial fibrillation.  4. History of gastroesophageal reflux disease. 5. Chronic arthritis. 6. Chronic thrombocytopenia.  7. Colon cancer.   PAST SURGICAL HISTORY:  1. C-section. 2. Appendectomy. 3. Tonsillectomy. 4. Cataract surgery.  ALLERGIES: Zetia, Motrin, Crestor and simvastatin.   SOCIAL HISTORY:  Patient lives alone now. She was just discharged from [facility]. No smoking. No alcohol. No drugs. Patient's son lives close by. Patient was started on oxygen.  FAMILY HISTORY: Father died in 6360s of heart failure. Mother died at the age of 79 of heart failure.   MEDICATIONS:  1. Amiodarone 100 mg p.o. b.i.Conway.  2. Sucralfate 1 gram before each meal. 3. Lasix 20 mg daily as needed if the weight gain is more than three pounds a day.  4. Ondansetron 4 mg q.i.Conway.  5. ProAir 90 mcg q.i.Conway. 6. Tramadol 50 mg daily as needed for pain. 7. Coumadin 4 mg daily at 5:00 p.m.  8. Fish oil 1 gram p.o. b.i.Conway.  9. Furosemide 40 mg p.o. b.i.Conway. That is also written in the discharge reconciliation but patient's granddaughter says she takes only 20 p.o. b.i.Conway.  10. Omeprazole 20 mg p.o. daily.  11. KCl 10 mEq p.o. b.i.Conway.  12. Senna as needed for constipation.  13. Baby aspirin 81 mg daily. 14. Isosorbide mononitrate 30 mg p.o. daily.  15. Lisinopril 5 mg p.o. daily.  16. Metolazone 2.5 mg p.o. daily.  17. MiraLax 17 grams with water.   OXYGEN: 2 liters by nasal cannula.   REVIEW OF SYSTEMS: CONSTITUTIONAL: Feels a lot of fatigue and weakness and also poor appetite. EYES: No blurred vision. ENT: No tinnitus. No epistaxis. No difficulty swallowing. RESPIRATORY: Has cough with phlegm and also has some wheezing. CARDIOVASCULAR: Patient had chest pain last night. GASTROINTESTINAL: No nausea, no vomiting. No abdominal pain. GENITOURINARY: No dysuria. ENDOCRINE: No polyuria, nocturia. HEMATOLOGIC: No anemia. ENDOCRINE: No skin rashes.  MUSCULOSKELETAL: Complains of chronic arthritis. NEUROLOGIC: Patient denies any numbness but feels weak, generalized weakness. No dysarthria. NEUROLOGICAL: No epilepsy. No CVA. No seizures.  PSYCH: No anxiety or insomnia.   PHYSICAL EXAMINATION:  VITAL SIGNS: Temperature 97.6, pulse 60, respirations 16, blood pressure 133/51, saturation 88% on room air. She is on 2 liters saturating  around 92%.   GENERAL: Patient is alert, awake, oriented, elderly female, not in distress.   HEENT: Head atraumatic, normocephalic. Pupils equally reacting to light. Extraocular movements are intact.   ENT: No tympanic membrane congestion. No turbinate hypertrophy. No oropharyngeal erythema.    NECK: Normal range of motion. No JVD. No carotid bruit. No lymphadenopathy.   CARDIOVASCULAR: S1 and S2 regular. No murmurs. PMI not displaced. Patient has trace pedal edema.   LUNGS: Bilateral expiratory wheeze present mainly in the right and left middle airway of the lungs. Not using accessory muscles of respiration.   ABDOMEN: Soft, obese. Bowel sounds present. No organomegaly. No hernia.   EXTREMITIES: 1+ edema.   PSYCH: Mood and affect are within normal limits.   NEUROLOGICAL: Alert, awake, oriented. Cranial nerves II through XII intact. Power 5/5 upper and lower extremities. Sensation is intact. Deep tendon reflexes 2+ bilaterally.   LABORATORY, DIAGNOSTIC AND RADIOLOGICAL DATA: EKG showed normal sinus rhythm with no ST-T changes, 63 beats per minute. Amber-colored urine. No leukocyte esterase. WBC 4.8, hemoglobin 11.8, hematocrit 33.5, platelets 164. Electrolytes: Sodium 145, potassium 3.2, chloride 77, bicarbonate 42, BUN 28, creatinine 1.92, glucose 98, INR 1.9. Chest x-ray is not done, I am going to order that.   ASSESSMENT AND PLAN: 81 year old female with history of diastolic heart failure on Lasix, metolazone and lisinopril came in with:  1. Syncope. Patient has hyponatremia, acute renal failure probably secondary to overdiuresis causing her to have syncope. Hold the Lasix, lisinopril and furosemide at this time and continue gentle hydration with potassium. Patient does have hyponatremia, hypokalemia so hold Lasix, lisinopril, metolazone. Start gentle hydration with potassium. Check BMP in the morning. 2. Acute renal failure due to dehydration, overdiuresis. Hold the Lasix, lisinopril,  metolazone. Continue fluids. Recheck BMP in the morning.  3. Acute bronchitis by symptoms, patient has cough and brown colored phlegm so add Z-Pak. Continue oxygen. Continue Pulmicort and DuoNebs. Chest x-ray of the chest.  4. Patient has chronic atrial fibrillation on Coumadin. INR 1.1. Continue Coumadin. Patient also has amiodarone. Heart rate has been 60s so continue the same dose of amiodarone.   5. Generalized weakness. Patient was discharged to [facility] and she is supposed to start physical therapy today at home. Patient will be seen by physical therapist during this hospital stay.  6. Chronic diastolic heart failure. Echocardiogram showed ejection fraction of 55% with elevated right ventricular pressure. Patient has been seen by cardiologist, Dr. Mariah Milling, last admission. Will hold Lasix. During the last discharge Dr. Mariah Milling recommended Lasix 40 p.o. b.i.Conway. but granddaughter unsure what she is getting, is it 40 or 20, she is unsure. Anyway, will hold the medication because of acute renal failure and hyponatremia.  7. History of coronary artery disease with percutaneous transluminal coronary angioplasty in 2009. Continue the aspirin.   TIME SPENT: About 60 minutes on history and physical.    ____________________________ Katha Hamming, MD sk:cms Conway: 06/08/2012 16:02:29 ET T: 06/08/2012 16:55:01 ET JOB#: [id number]  cc: Katha Hamming, MD, <Dictator> Karie Schwalbe, MD Katha Hamming MD ELECTRONICALLY SIGNED 07/21/2012 14:47

## 2014-11-22 NOTE — H&P (Signed)
PATIENT NAME:  Melchor AmourFOUSHEE, Vidhi D MR#:  [id number] DATE OF BIRTH:  10/13/1921  DATE OF ADMISSION:  04/28/2012  PRIMARY CARE PHYSICIAN: Tillman Abideichard Letvak, MD   CARDIOLOGIST: Julien Nordmannimothy Gollan, MD  CHIEF COMPLAINT: Shortness of breath.   HISTORY OF PRESENT ILLNESS: The patient is an 81 year old female with past medical history of congestive heart failure with valvular abnormalities, atrial fibrillation. She presents with several days of worsening shortness of breath, a smothering type feeling. She can hardly make it to the bathroom without shortness of breath and she lives alone. She is having sweating episodes and chest pain episodes that come and go. This morning was her last episode of chest pain, like somebody sitting on her chest, not so severe, went away when she came here. Currently she is chest pain free. Nothing else made it better or worse. She is feeling weak, and she is having some pain in her left upper shoulder blade area and also a choking feeling sometimes when swallowing. Hospitalist Services were contacted for evaluation for admission secondary to congestive heart failure. The patient had an elevated BNP and a chest x-ray that showed pulmonary edema.   PAST MEDICAL HISTORY:  1. Arthritis. 2. Congestive heart failure with valvular abnormalities.  3. Atrial fibrillation. 4. Colon cancer, status post surgery.  5. Gastroesophageal reflux disease.  6. Hyperlipidemia.   PAST SURGICAL HISTORY:  1. Cancer resection.  2. Appendectomy.  3. Tonsillectomy.  4. Cataracts.   ALLERGIES: Zetia, Motrin, Crestor and simvastatin.  MEDICATIONS:   1. Amiodarone 200 mg, 1/2 tablet b.i.d.  2. Aspirin 81 mg daily.  3. Fish oil 1000 mg b.i.d. 4. Lasix 40 mg, 1 tablet b.i.d. 5. Imdur 30 mg daily.  6. Lisinopril 5 mg daily.  7. Metoprolol 100 mg, 1/2 tablet t.i.d.  8. Nitroglycerin 0.4 mg every 5 minutes as needed for chest pain.  9. Omeprazole 20 mg b.i.d.  10. Potassium chloride 10 mEq b.i.d.   11. Tramadol 50 mg every 6 hours as needed for pain. 12. Warfarin 5 mg at bedtime.  13. MiraLax p.r.n.   SOCIAL HISTORY: She lives alone. No smoking. No alcohol. No drug use. Son lives close by. 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   FAMILY HISTORY: Father died in his 5060s of heart failure. Mother died at 3595 of heart failure.   REVIEW OF SYSTEMS: CONSTITUTIONAL: Positive for sweats. No fever. No chills. Positive weight gain, cannot quantify. Positive for weakness. EYES: She does wear reading glasses. EARS, NOSE, MOUTH, AND THROAT: Decreased hearing. Positive for runny nose. No sore throat. No difficulty swallowing. CARDIOVASCULAR: Positive for chest pain. No palpitations. RESPIRATORY: Positive for shortness of breath. Positive for cough. Positive for sputum, red-tan or whitish. GASTROINTESTINAL: Positive for nausea. No vomiting. Decreased appetite. Occasional abdominal pain. Occasional diarrhea. No bright red blood per rectum. No melena. GENITOURINARY: No burning on urination or hematuria. MUSCULOSKELETAL: Positive for arthritis pain. INTEGUMENTARY: Positive for itching on her arms. NEUROLOGIC: Positive for fainting with higher dose of metoprolol. PSYCHIATRIC: No anxiety or depression. ENDOCRINE: No thyroid problems. HEMATOLOGIC/LYMPHATIC: No anemia. No easy bruising or bleeding.   PHYSICAL EXAMINATION:  VITAL SIGNS: Temperature 98, pulse 57, respirations 20, blood pressure 166/79, pulse oximetry 95% on oxygen.   GENERAL: No respiratory distress, sitting up in bed.   HEENT: Eyes: Conjunctivae and lids normal. Pupils are equal, round, and reactive to light. Extraocular muscles are intact. No nystagmus. Ears, nose, mouth, and throat: Tympanic membranes no erythema. Nasal mucosa no erythema. Throat  no erythema. No exudate seen. Lips and gums no lesions.   NECK: Positive JVD. No bruits. No lymphadenopathy. No thyromegaly. No thyroid nodules palpated.   RESPIRATORY: Decreased breath  sounds bilaterally. Positive rales halfway up lung field. No wheeze or rhonchi heard.   CARDIOVASCULAR: S1, S2 normal. A positive 2 out of 6 systolic ejection murmur. Carotid upstroke 2+ bilaterally. No bruits. Dorsalis pedis pulses 1+ bilaterally, 2+ edema bilateral lower extremity.   ABDOMEN: Soft, nontender. No organomegaly/splenomegaly. Normoactive bowel sounds. No masses felt.   LYMPHATIC: No lymph nodes in the neck.   MUSCULOSKELETAL: 2+ edema. No clubbing. No cyanosis.   SKIN: Some erythema on the right arm from secondary excoriations.   NEUROLOGIC: Cranial nerves II through XII are grossly intact.   PSYCHIATRIC: The patient is oriented to person, place, and time.   LABORATORY, DIAGNOSTIC AND RADIOLOGICAL DATA: EKG looks sinus to me with prolonged QT, incomplete left bundle branch block, slight ST depression laterally, poor R wave progression. Troponin is negative. White blood cell count 4.5, hemoglobin and hematocrit 11.3 and 34.7, platelet count 111. Glucose 118, BUN 19, creatinine 1.07, sodium 141, potassium 3.5, chloride 104, CO2 30, calcium 8.0. Liver function tests are normal except for albumin low at 3.1. BNP 2267. INR 3.0. Chest x-ray shows cardiomegaly with pulmonary edema.   ASSESSMENT AND PLAN:  1. Acute respiratory failure: Pulse oximetry is 87% on room air. I will give oxygen supplementation.  2. Acute congestive heart failure with valvular abnormalities: I will get an echocardiogram for further evaluation. I will give IV Lasix 40 mg IV b.i.d. The patient is already on metoprolol and lisinopril. The patient follows with Dr. Mariah Milling, Cardiology. I will ask for consultation.  3. Chest pain and prolonged QTc:  We will monitor on telemetry. Get serial cardiac enzymes to rule out myocardial infarction and continue to monitor.  4. Atrial fibrillation: Looks like sinus right now, currently rate controlled. INR is therapeutic on Coumadin.  5. Gastroesophageal reflux disease:  Continue omeprazole.  6. Arthritis: On aspirin and tramadol.  7. Hyperlipidemia: On fish oil. Will check a lipid profile in the a.m.  8. A choking sensation: I will check a TSH and get a swallow evaluation. Could be secondary to the congestive heart failure also. I will continue to monitor, continue PPI.  9. Thrombocytopenia: I do not have any prior labs. I will continue to monitor here tomorrow.   CODE STATUS:  The patient is a FULL CODE.      TIME SPENT PATIENT CARE: 55 minutes.  ____________________________ Herschell Dimes. Renae Gloss, MD rjw:cbb D: 04/28/2012 15:23:18 ET T: 04/28/2012 15:44:07 ET JOB#: [id number]  cc: Herschell Dimes. Renae Gloss, MD, <Dictator> Karie Schwalbe, MD Antonieta Iba, MD Salley Scarlet MD ELECTRONICALLY SIGNED 05/05/2012 21:26

## 2014-11-22 NOTE — Consult Note (Signed)
Chief Complaint:   Subjective/Chief Complaint denies nausea or abdominal pain.  poor appetite, denies dysphagia, but only eating soft foods.   VITAL SIGNS/ANCILLARY NOTES: **Vital Signs.:   29-Sep-13 11:30   Vital Signs Type Q 4hr   Temperature Temperature (F) 98.3   Celsius 36.8   Temperature Source oral   Pulse Pulse 63   Respirations Respirations 16   Systolic BP Systolic BP 148   Diastolic BP (mmHg) Diastolic BP (mmHg) 65   Mean BP 92   Pulse Ox % Pulse Ox % 95   Pulse Ox Activity Level  At rest   Oxygen Delivery 3L   Brief Assessment:   Cardiac Regular    Respiratory clear BS    Gastrointestinal details normal Soft  Nontender  Nondistended  No masses palpable  Bowel sounds normal   Lab Results: Routine Chem:  29-Sep-13 04:32    Result Comment hgb - add on cbc, see acc# [id number]  Result(s) reported on 03 May 2012 at 08:17AM.  Routine Coag:  29-Sep-13 04:32    Prothrombin  29.7   INR 2.8 (INR reference interval applies to patients on anticoagulant therapy. A single INR therapeutic range for coumarins is not optimal for all indications; however, the suggested range for most indications is 2.0 - 3.0. Exceptions to the INR Reference Range may include: Prosthetic heart valves, acute myocardial infarction, prevention of myocardial infarction, and combinations of aspirin and anticoagulant. The need for a higher or lower target INR must be assessed individually. Reference: The Pharmacology and Management of the Vitamin K  antagonists: the seventh ACCP Conference on Antithrombotic and Thrombolytic Therapy. Chest.2004 Sept:126 (3suppl): L78706342045-2335. A HCT value >55% may artifactually increase the PT.  In one study,  the increase was an average of 25%. Reference:  "Effect on Routine and Special Coagulation Testing Values of Citrate Anticoagulant Adjustment in Patients with High HCT Values." American Journal of Clinical Pathology 2006;126:400-405.)  Routine Hem:  29-Sep-13  04:32    WBC (CBC) 3.8   RBC (CBC)  3.42   Hemoglobin (CBC)  10.1   Hemoglobin (CBC)  10.1   Hematocrit (CBC)  30.6   Platelet Count (CBC)  91   MCV 89   MCH 29.6   MCHC 33.1   RDW  16.3   Neutrophil % 65.7   Lymphocyte % 22.7   Monocyte % 9.7   Eosinophil % 1.5   Basophil % 0.4   Neutrophil # 2.5   Lymphocyte #  0.9   Monocyte # 0.4   Eosinophil # 0.1   Basophil # 0.0 (Result(s) reported on 03 May 2012 at 09:11AM.)   Assessment/Plan:  Assessment/Plan:   Assessment 1) dysphagia-esophageal diverticulum, 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  no plans for egd at this point, high risk for  sedated proceedure. Continue daily ppi, soft diet/SLP recommendations.    Plan as above, will sign off. reconsult if needed.   Electronic Signatures: Barnetta ChapelSkulskie, Amma Crear (MD)  (Signed 29-Sep-13 14:23)  Authored: Chief Complaint, VITAL SIGNS/ANCILLARY NOTES, Brief Assessment, Lab Results, Assessment/Plan   Last Updated: 29-Sep-13 14:23 by Barnetta ChapelSkulskie, Junell Cullifer (MD)

## 2014-11-22 NOTE — Consult Note (Signed)
PATIENT NAME:  Deborah Conway, Deborah Conway MR#:  [id number] DATE OF BIRTH:  Oct 23, 1921  DATE OF CONSULTATION:  04/30/2012  REFERRING PHYSICIAN:  Dr. Posey Pronto CONSULTING PHYSICIAN:  Dr. Eddie Dibbles Oh/ Payton Emerald, NP  PRIMARY CARE PHYSICIAN: Dr. Viviana Simpler  CARDIOLOGIST: Dr. Ida Rogue     REASON FOR CONSULTATION: Reflux and coughing.   HISTORY OF PRESENT ILLNESS: [name] is an 81 year old Caucasian female who has a significant past medical history of arthritis, congestive heart failure with valvular abnormality, atrial fibrillation, colon cancer, reflux, and hyperlipidemia. She presented to [facility] Emergency Room for worsening shortness of breath which had been occurring for the past week or so to the point where she felt like she was smothering. She could barely make it to the bathroom without becoming short of breath and she does live alone. Acute chest pain as well as diaphoresis. She was found to be hypoxic with a pulse oximetry in the 80s upon admission. Currently she is pain free involving her chest. Continues to feel weak. Complaining of pain underneath left anterior ribcage underneath to her shoulder blade. Associated nausea as well over the past week and a half. No vomiting. Productive cough with dark sputum at times, states also very salty tasting. She had been diagnosed with congestive heart failure on admission. GI consultation requested due to dysphagia. States that she has had difficulty swallowing for at least two years. History of heartburn for numerous years for which she has been taking omeprazole 20 mg initially once a day and over the past 2 to 3 years increased to twice a day. No hematemesis. Does take MiraLAX on a daily basis for mild constipation and bowels do move on average every day. Prior to admission her last bowel movement had been Tuesday. She had a bowel movement yesterday evening and then this morning. No evidence of rectal bleeding or melena.   PAST MEDICAL HISTORY:   1. Arthritis.  2. Congestive heart failure with valvular abnormality.  3. Atrial fibrillation.  4. Colon cancer.  5. Gastroesophageal reflux disease. 6. Hyperlipidemia.   PAST SURGICAL HISTORY: 1. Colon resection 1989.  2. Appendectomy.  3. Tonsillectomy.  4. Cataract surgery.  5. Right knee replacement 2010.   ALLERGIES: Zetia, Motrin, Crestor, and simvastatin.   HOME MEDICATIONS:  1. Amiodarone 200 mg, 1/2 tablet twice a day.  2. Aspirin 81 mg a day. 3. Fish oil 1,000 mg twice a day.  4. Lasix 40 mg 1 tablet twice a day.  5. Imdur 30 mg a day.  6. Lisinopril 5 mg a day. 7. Metoprolol 100 mg, 1/2 tablet 3 times a day.  8. Nitroglycerin 0.4 mg every five minutes as needed for chest pain as directed.  9. Omeprazole 20 mg twice a day.  10. Potassium chloride 10 mEq twice a day.  11. Tramadol 50 mg every six hours as needed.  12. Warfarin 5 mg at bedtime.  13. MiraLAX one capful in 8 ounces of liquid on a daily basis.   FAMILY HISTORY: Father deceased in his 58s from congestive heart failure. Mother deceased at the age of 61 from heart failure. Brother congestive heart failure. Brother cancer but unknown specific details.   SOCIAL HISTORY: The patient does reside by herself but her son lives very close by. History of  smoking from the age of 48 to the year 2000 on and off. No alcohol. Prior work in [employer].    REVIEW OF SYSTEMS: CONSTITUTIONAL: Significant for being diaphoretic prior to admission. No  fevers. Significant for 2 to 3-pound weight gain, weakness on admission. EYES: Does wear glasses. ENT: Decreased hearing. Positive for runny nose. No sore throat. Significant for dysphagia. CARDIOVASCULAR: Chest pain on admission, which has since resolved. No heart palpitations. RESPIRATORY: Significant for shortness of breath and productive cough, clear, dark-colored sputum tannish red. GI: See history of present illness. GU: No dysuria, hematuria. MUSCULOSKELETAL: Significant  for arthritic discomfort. INTEGUMENT: Significant for itching on her arms. NEUROLOGIC: History of syncopal episodes with a higher dose of metoprolol. PSYCH: No depression. No anxiety. ENDOCRINE: No thyroid concerns. HEME/LYMPH: Denies history of anemia. Significant for easy bruising and bleeding.   PHYSICAL EXAMINATION:  VITAL SIGNS: Temperature 97.5, pulse 53, respirations 20, blood pressure 150/70 with a pulse oximetry of 97% at 15:41 today.   GENERAL: Well developed, overweight 81 year old Caucasian female, no acute distress noted.   HEENT: Normocephalic, atraumatic. Pupils equal, reactive to light. Conjunctivae clear. Sclerae anicteric.   NECK: Supple. Trachea midline. No lymphadenopathy or thyromegaly.   PULMONARY: Symmetric rise and fall of chest. Scattered expiratory wheezes noted throughout. Decreased breath sounds bilaterally to lower lobe.   CARDIOVASCULAR: Regular rhythm, S1 and S2. 2/6 systolic murmur.   ABDOMEN: Soft, nondistended. Bowel sounds in four quadrants. No bruits. No masses.   RECTAL: Deferred.   MUSCULOSKELETAL:  Moving all four extremities. No contractures. No clubbing.   EXTREMITIES: No edema.   NEUROLOGICAL: No gross neurological deficits.   PSYCH: Alert and oriented times four. Memory grossly intact. Appropriate affect and mood.   SKIN: No evidence of jaundice or cyanosis.   LABORATORY, DIAGNOSTIC, AND RADIOLOGICAL DATA: Chemistry panel on admission: Glucose 118, BUN 19, eGFR is 46. Comparison today, BUN at 20, creatinine at 1, CO2 is 34. eGFR is less than 50. Calcium on admission was 8, which has improved to 8.6. Hepatic panel within normal limits, except albumin 3.1. Cardiac enzymes all three in series within normal limits. TSH is 5.04. CBC on admission: Hemoglobin was 11.3 with hematocrit of 34.7, platelet count 111,000. Yesterday, hemoglobin dropped to 10.3 with hematocrit of 30.9 and platelet count of 105,000. PT on admission 31.3 with an INR of 3, today  32.2 with an INR of 3.1. Warfarin currently being held. Aspirin 81 mg being continued. EKG revealed junctional rhythm, left axis deviation. Echocardiogram revealed ejection fraction of 55%, right ventricular systolic function is normal, left ventricular systolic function is normal, left atrium is mildly dilated. Moderate mitral regurgitation and mild to moderate tricuspid regurgitation. Right ventricular systolic pressure is elevated at greater than 60 mmHg and elevated RVSP consistent with severe pulmonary hypertension.  Chest x-ray on 09/24 revealed cardiomegaly with pulmonary edema and right lung base atelectasis.    The patient had speech therapy bedside consultation done for the indication of dysphagia. In reviewing documentation it does note the recommendation of mechanical soft diet with thin liquids with moistened foods, aspiration precautions. She was re-evaluated today given these recommendations. Noted suspect esophageal phase dysmotility. The patient does experience belching and nonproductive cough post the meal during talking and report that she has continued to have a lot of phlegm coming up. Thus it appears there is the concern for possible reflux as underlying cause. The patient has been able to feed herself well without any difficulty.  Report does note yesterday that she demonstrated no overt signs or symptoms of aspiration with all trial  consistencies tested. The patient consumed trials within liquids, mechanical soft with no coughing or throat clearing being noted. Vocal quality clearing between trials  was assessed. Laryngeal elevation appeared WFL during the swallow. There were no changes in respiratory status noted with p.o. intake. The patient was consistently talking during trials which increased her respiratory rate slightly. Oral phase C/B timely mastication and AP transfer time with all boluses. Few solid liquids? were eaten though the patient preferred to nibble on her food.  Appropriate oral clearing post trial noted.   IMPRESSION:  Dysphagia, noted difficulty swallowing liquids as well as solids. Longstanding history of reflux. Admitted for congestive heart failure. Known history of congestive heart failure with valvular abnormality as well as atrial fibrillation requiring chronic anti-thrombolytic therapy. Over-coagulated state at this time. Warfarin being held.   PLAN: The patient's presentation was discussed with Dr. Verdie Shire. Recommendation is to proceed with barium swallow study tomorrow to allow further evaluation of dysphagia. Currently do not recommend proceeding forward with endoscopic evaluation until coagulation state has been corrected and barium swallow study has been reviewed. Recommend continuing PPI therapy at this time.     These services provided by Payton Emerald, NP in collaborative agreement with Dr. Verdie Shire.    ____________________________ Payton Emerald, NP dsh:bjt D: 04/30/2012 16:16:23 ET T: 05/01/2012 09:04:27 ET JOB#: [id number]  cc: Payton Emerald, NP, <Dictator> Payton Emerald MD ELECTRONICALLY SIGNED 05/06/2012 15:13

## 2014-11-22 NOTE — Discharge Summary (Signed)
PATIENT NAME:  Deborah Conway, Deborah Conway MR#:  [id number] DATE OF BIRTH:  May 17, 1922  DATE OF ADMISSION:  06/08/2012 DATE OF DISCHARGE:  06/11/2012  PRIMARY CARE PHYSICIAN: Tillman Abide, MD  DISCHARGE DIAGNOSES:  1. Syncope due to dehydration with acute renal failure, overdiuresis.  2. Acute renal failure with hyponatremia/hyperkalemia, which is improved.  3. Acute bronchitis.  4. Chronic diastolic heart failure. 5. Generalized weakness.  6. Coronary artery disease with stent.  7. History of chronic atrial fibrillation.  8. Arthritis. 9. Gastroesophageal reflux disease.   DISCHARGE MEDICATIONS:  1. Tramadol 50 mg p.o. every six hours p.r.n. for pain. 2. Senna as needed for constipation. 3. Aspirin 81 mg daily.  4. Isosorbide mononitrate 30 mg p.o. daily.  5. Lisinopril 5 mg daily. 6. Metolazone 2.5 mg p.o. daily.  7. MiraLax as needed for constipation.  8. Amiodarone 100 mg p.o. twice a day. 9. Sucralfate 1 gram p.o. three times daily.  10. Ondansetron 4 mg every six hours as needed for nausea.  11. Pro Air one puff four times daily.  12. Tylenol 325 mg every six hours as needed for pain.  13. Coumadin 4 mg daily. The patient needs PT-INR check-up with primary doctor.  14. Fish oil 1 gram p.o. twice a day. 15. Omeprazole 20 mg p.o. twice a day. 16. Lasix decreased to 20 mg p.o. daily.  17. KCL decreased to 10 milliequivalents p.o. daily.  18. Azithromycin 500 mg p.o. daily for two days.   ACTIVITY: The patient is going home with physical therapy. The patient already had physical therapy set before she came in. She is getting that through home health.   CONSULTATIONS: None.   HOSPITAL COURSE: The patient is an 81 year old female patient admitted on 06/08/2012 because of syncope. The patient was taking Lasix, Relafen, and Lisinopril, and was discharged from [facility] two days before she came. The patient was admitted for syncope with leg weakness. BUN was 28, creatinine 1.92, and  sodium was 125 on admission with potassium 3.2. So, she was admitted for syncope likely due to over diuresis. The patient's granddaughter fixes her medications, but she was not sure if she is giving her 40 mg twice a day or 20 mg twice a day of Lasix. Anyway, the patient was admitted to hospitalist service, started on IV fluids, gentle hydration with normal saline with potassium, and her Lasix was stopped along with lisinopril. Her white count was normal on admission. The patient's EKG showed normal sinus rhythm. Her BMP improved nicely. Troponins have been negative. Her sodium improved to 135 today and potassium 3.8. Her BUN and creatinine also normalized; BUN 18 and creatinine 0.94. The patient feels much better and she feels stable to go home. I called the granddaughter to let her know that the patient is going to be discharged. The patient's chest x-ray on admission showed interstitial edema with some atelectasis. She did have some cough which helped, Pulmicort, DuoNebs and also Zithromax. We are going to give her Zithromax to continue for two more days. She does have [employer] that she can continue. The patient has chronic atrial fibrillation. Rate is controlled with amiodarone. She is on amiodarone 100 mg p.o. twice a day and heart rate is around 66 here and blood pressure is controlled. She can continue that. She is on Coumadin. INR is 1.6 today. She continue to take Coumadin and follow-up with Dr. Alphonsus Sias regarding PT-INR check-up and adjustment of Coumadin. The patient has gastroesophageal reflux disease. Continue sucralfate and  omeprazole. For deconditioning, the patient is seen by the physical therapist and the patient walked 200 feet with a rolling walker and physical therapy recommended home with assist with home health.  TIME SPENT ON DISCHARGE PREPARATION: More than 30 minutes. ____________________________ Katha HammingSnehalatha Trasean Delima, MD sk:slb D: 06/11/2012 10:35:59 ET     T: 06/12/2012 10:25:37 ET        JOB#: [id number] cc: Katha HammingSnehalatha Fatiha Guzy, MD, <Dictator> Katha HammingSNEHALATHA Conrad Zajkowski MD ELECTRONICALLY SIGNED 06/27/2012 15:09

## 2014-11-22 NOTE — Consult Note (Signed)
Brief Consult Note: Diagnosis: Dysphagia.  Increase risk of aspiration noted by speech therapy evaluation. Suspected dysmotility disordered.  Admitted for shortness of breath in correlation with CHF. No valvular disorder as well as atrial fibrillation.  Chronic antithrombolytic state.  Over coagulation state at this time.  Warfarin being held.   Consult note dictated.   Orders entered.   Discussed with Attending MD.   Comments: Patient's presentation discussed with Dr. Lutricia FeilPaul Oh.  Recommendation is to proceed with barium swallow study tomorrow for the indication of dysphagia and allow further evaluation and assessment for possible motility disorder.  Continue with speech therapist recommendation and PPI therapy as prescribed.  Will continue to follow.  Do not recommend endoscopic evalution until INR has been corrected to level <1.5.  Wil proceed with clinically feasible.  Electronic Signatures: Rodman KeyHarrison, Dawn S (NP)  (Signed 26-Sep-13 16:19)  Authored: Brief Consult Note   Last Updated: 26-Sep-13 16:19 by Rodman KeyHarrison, Dawn S (NP)

## 2014-11-22 NOTE — Consult Note (Signed)
Chief Complaint:   Subjective/Chief Complaint denies n/v or abd pain.  no dysphagia today.  on soft diet.   VITAL SIGNS/ANCILLARY NOTES: **Vital Signs.:   28-Sep-13 12:00   Vital Signs Type Routine   Temperature Temperature (F) 98.2   Celsius 36.7   Temperature Source oral   Pulse Pulse 51   Respirations Respirations 20   Systolic BP Systolic BP 297   Diastolic BP (mmHg) Diastolic BP (mmHg) 63   Mean BP 81   Pulse Ox % Pulse Ox % 96   Pulse Ox Activity Level  At rest   Oxygen Delivery 3L; Nasal Cannula   Brief Assessment:   Cardiac Regular    Respiratory clear BS    Gastrointestinal details normal Soft  Nontender  Nondistended  No masses palpable  Bowel sounds normal   Lab Results: Routine Chem:  28-Sep-13 12:17    Glucose, Serum  133   BUN 18   Creatinine (comp) 1.07   Sodium, Serum 141   Potassium, Serum 4.2   Chloride, Serum 98   CO2, Serum  36   Calcium (Total), Serum 8.7   Anion Gap 7   Osmolality (calc) 285   eGFR (African American)  54   eGFR (Non-African American)  46 (eGFR values <74mL/min/1.73 m2 may be an indication of chronic kidney disease (CKD). Calculated eGFR is useful in patients with stable renal function. The eGFR calculation will not be reliable in acutely ill patients when serum creatinine is changing rapidly. It is not useful in  patients on dialysis. The eGFR calculation may not be applicable to patients at the low and high extremes of body sizes, pregnant women, and vegetarians.)  Routine Coag:  28-Sep-13 04:39    Prothrombin  32.4   INR 3.2 (INR reference interval applies to patients on anticoagulant therapy. A single INR therapeutic range for coumarins is not optimal for all indications; however, the suggested range for most indications is 2.0 - 3.0. Exceptions to the INR Reference Range may include: Prosthetic heart valves, acute myocardial infarction, prevention of myocardial infarction, and combinations of aspirin and  anticoagulant. The need for a higher or lower target INR must be assessed individually. Reference: The Pharmacology and Management of the Vitamin K  antagonists: the seventh ACCP Conference on Antithrombotic and Thrombolytic Therapy. LGXQJ.1941 Sept:126 (3suppl): N9146842. A HCT value >55% may artifactually increase the PT.  In one study,  the increase was an average of 25%. Reference:  "Effect on Routine and Special Coagulation Testing Values of Citrate Anticoagulant Adjustment in Patients with High HCT Values." American Journal of Clinical Pathology 2006;126:400-405.)   Assessment/Plan:  Assessment/Plan:   Assessment 1) dysphagia.  barium swallow showing small cervical level diverticulum, probably not a big problem, but an indicator of possible pulsion disorder.  likely esophageal dysmotility.    Plan 1) recommend following SLP recs for eating.  Patient theraputic on anticoagulant.  no plan for egd at present. Can followup with GI as o/p if needed. will sign off  for now, reconsult as needed.   Electronic Signatures: Loistine Simas (MD)  (Signed 28-Sep-13 12:47)  Authored: Chief Complaint, VITAL SIGNS/ANCILLARY NOTES, Brief Assessment, Lab Results, Assessment/Plan   Last Updated: 28-Sep-13 12:47 by Loistine Simas (MD)

## 2014-11-22 NOTE — Discharge Summary (Signed)
PATIENT NAME:  Deborah Conway, [name] MR#:  [id number] DATE OF BIRTH:  May 14, 1922  DATE OF ADMISSION:  04/28/2012 DATE OF DISCHARGE:  05/04/2012  PRIMARY CARE PHYSICIAN: Dr. Tillman Abideichard Letvak  PRIMARY CARDIOLOGIST: Dr. Mariah MillingGollan  DISCHARGE DIAGNOSES:  1. Acute on chronic diastolic congestive heart failure.  2. Acute bronchitis.  3. Acute hypoxic respiratory failure.  4. Chronic atrial fibrillation.  5. Supratherapeutic INR.  6. Gastroesophageal reflux disease.  7. Chronic arthritis.  8. Chronic thrombocytopenia, mild without bleeding.   CONSULTANT: Dr. Mariah MillingGollan of cardiology.   LABORATORY, DIAGNOSTIC AND RADIOLOGICAL DATA:  Chest x-ray which showed pulmonary edema.   2-D echocardiogram which showed ejection fraction greater than 55% with moderate mitral regurgitation and right ventricular systolic pressure greater than 60%.   ADMITTING HISTORY AND PHYSICAL: Please see detailed history and physical dictated on 04/28/2012. In brief, 81 year old female patient with past history of congestive heart failure, valvular abnormalities, atrial fibrillation presented to the Emergency Room complaining of worsening shortness of breath and not feeling well. Patient was found to be in acute on chronic congestive heart failure and admitted to the hospitalist service.   HOSPITAL COURSE:  1. Acute on chronic diastolic congestive heart failure. Patient had repeat 2-D echocardiogram which showed ejection fraction greater than 55% along with moderate MR and severe pulmonary hypertension. Patient was started on IV Lasix which she diuresed well and her shortness of breath improved significantly. She was also on beta blocker, ACE inhibitors which were continued. Patient is on 1 to 2 liters of oxygen at the time of discharge and will need oxygen at skilled nursing facility for a few more days while she is diuresed. Patient was seen by Dr. Mariah MillingGollan of cardiology who agreed with the plan and patient will follow up with him in a  week.  2. Acute bronchitis. Patient did have some wheezing, rhonchi along with brown sputum, has been started on Levaquin and this is improving with breathing treatments and Levaquin.  3. Dysphagia. Patient did have some mild dysphagia at the time of admission, had Zenker's diverticulum on barium swallow. No esophageal stricture. On the day of discharge patient does not complain of any dysphagia but is on a soft diet and will continue this, needs to be followed.  4. Chronic atrial fibrillation on Coumadin. Patient's atrial fibrillation has been well controlled on beta blockers, metoprolol. Her Coumadin dose has been decreased from 5 to 4.5 a day as she had elevated INR in the 3s.   CONDITION ON DISCHARGE: On the day of discharge patient is saturating 93% on 1.5 liters oxygen, blood pressure 133/69, pulse 60, breathing 18 per minute. Lung examination shows bilateral basal crackles much improved and is being discharged to skilled nursing facility in fair condition.  DISCHARGE MEDICATIONS:  1. Aspirin 81 mg oral once a day.  2. Fish oil 1000 mg oral 2 times a day.  3. Isosorbide mononitrate 30 mg oral once a day.  4. Lisinopril 5 mg oral once a day.  5. Metoprolol tartrate 50 mg oral 3 times a day.  6. Nitroglycerin 0.4 mg sublingual as needed for chest pain.  7. Omeprazole 20 mg oral 2 times a day.  8. Potassium chloride 10 mEq oral 2 times a day. 9. Tramadol 50 mg oral every six hours as needed for pain.  10. Amiodarone 200 mg 1/2 tablet oral twice a day.  11. Acetaminophen 325 mg 2 tablets orally every four hours as needed for pain or fever.  12. Coumadin 4 mg oral  once a day.  13. Lasix 40 mg oral 2 times a day.  14. Zofran 4 mg oral 4 times a day as needed for nausea.  15. Levaquin 500 mg oral once a day for four days.  16. ProAir HFA 1 puff inhaled 4 times a day as needed for wheezing or shortness of breath.   DISCHARGE INSTRUCTIONS: Continue oxygen of 2 liters at skilled nursing  facility. Follow up with primary care physician in a week, Dr. Mariah Milling in a week. Patient's weight has to be checked every day and Lasix dose needs to be changed by the physician at skilled nursing facility as needed. Continue medications as recommended. Vitals to be checked twice a day. Patient will be on a cardiac diet with activity as tolerated with assistance at all times.   TIME SPENT: Time spent today on this discharge dictation along with coordinating care and counseling of the patient was 40 minutes.   ____________________________ Molinda Bailiff Yamilex Borgwardt, MD srs:cms D: 05/04/2012 09:47:04 ET T: 05/04/2012 10:02:17 ET JOB#: [id number]  cc: Wardell Heath R. Elpidio Anis, MD, <Dictator> Karie Schwalbe, MD Antonieta Iba, MD Orie Fisherman MD ELECTRONICALLY SIGNED 05/07/2012 11:19

## 2014-11-22 NOTE — Consult Note (Signed)
General Aspect primary care physician: Dr. Silvio Pate.  Primary cardiologist: Dr. Rockey Situ.   The patient is an 81 year old female with past medical history of coronary artery disease status post PCI in 2009, congestive heart failure with most recent ejection fraction of 35-40 , severe pulmonary hypertension with tricuspid regurgitation and atrial fibrillation.  She presents with several days of worsening shortness of breath, a smothering type feeling. She can hardly make it to the bathroom without shortness of breath and she lives alone. She is having sweating episodes and chest pain episodes that come and go. This morning was her last episode of chest pain, like somebody sitting on her chest, not so severe, went away when she came here.  The patient had an elevated BNP and a chest x-ray that showed pulmonary edema.   Physical Exam:   GEN no acute distress    NECK supple  positive JVD    RESP no use of accessory muscles  crackles  extensive crackles bilaterally    CARD Regular rate and rhythm  Murmur  3/6 holosystolic murmur at the left sternal border and apex    ABD denies tenderness    LYMPH negative axillae    EXTR negative cyanosis/clubbing, positive edema    SKIN normal to palpation    NEURO cranial nerves intact    PSYCH alert, A+O to time, place, person   Review of Systems:   Subjective/Chief Complaint dyspnea and orthopnea.    General: Fatigue    Skin: No Complaints    ENT: No Complaints    Eyes: No Complaints    Neck: No Complaints    Respiratory: Short of breath    Cardiovascular: Dyspnea  Orthopnea  Edema    Gastrointestinal: No Complaints    Genitourinary: No Complaints    Vascular: No Complaints    Musculoskeletal: No Complaints    Neurologic: No Complaints    Hematologic: No Complaints    Endocrine: No Complaints    Psychiatric: No Complaints   Lab Results: Hepatic:  24-Sep-13 13:05    Bilirubin, Total 0.7   Alkaline Phosphatase 97   SGPT  (ALT) 35   SGOT (AST) 36   Total Protein, Serum 6.7   Albumin, Serum  3.1  Routine Chem:  24-Sep-13 13:05    Glucose, Serum  118   BUN  19   Creatinine (comp) 1.07   Sodium, Serum 141   Potassium, Serum 3.5   Chloride, Serum 104   CO2, Serum 30   Calcium (Total), Serum  8.0   Osmolality (calc) 285   eGFR (African American)  54   eGFR (Non-African American)  46 (eGFR values <13m/min/1.73 m2 may be an indication of chronic kidney disease (CKD). Calculated eGFR is useful in patients with stable renal function. The eGFR calculation will not be reliable in acutely ill patients when serum creatinine is changing rapidly. It is not useful in  patients on dialysis. The eGFR calculation may not be applicable to patients at the low and high extremes of body sizes, pregnant women, and vegetarians.)   Anion Gap 7   B-Type Natriuretic Peptide ([facility]  2267 (Result(s) reported on 28 Apr 2012 at 01:50PM.)  Cardiac:  24-Sep-13 13:05    Troponin I < 0.02 (0.00-0.05 0.05 ng/mL or less: NEGATIVE  Repeat testing in 3-6 hrs  if clinically indicated. >0.05 ng/mL: POTENTIAL  MYOCARDIAL INJURY. Repeat  testing in 3-6 hrs if  clinically indicated. NOTE: An increase or decrease  of 30% or more on serial  testing suggests a  clinically important change)   CK, Total 37   CPK-MB, Serum 0.9 (Result(s) reported on 28 Apr 2012 at 01:50PM.)  Routine Coag:  24-Sep-13 13:05    Prothrombin  31.1   INR 3.0 (INR reference interval applies to patients on anticoagulant therapy. A single INR therapeutic range for coumarins is not optimal for all indications; however, the suggested range for most indications is 2.0 - 3.0. Exceptions to the INR Reference Range may include: Prosthetic heart valves, acute myocardial infarction, prevention of myocardial infarction, and combinations of aspirin and anticoagulant. The need for a higher or lower target INR must be assessed individually. Reference: The Pharmacology  and Management of the Vitamin K  antagonists: the seventh ACCP Conference on Antithrombotic and Thrombolytic Therapy. [id number] Sept:126 (3suppl): N9146842. A HCT value >55% may artifactually increase the PT.  In one study,  the increase was an average of 25%. Reference:  "Effect on Routine and Special Coagulation Testing Values of Citrate Anticoagulant Adjustment in Patients with High HCT Values." American Journal of Clinical Pathology 2006;126:400-405.)  Routine Hem:  24-Sep-13 13:05    WBC (CBC) 4.5   RBC (CBC) 3.91   Hemoglobin (CBC)  11.3   Hematocrit (CBC)  34.7   Platelet Count (CBC)  111 (Result(s) reported on 28 Apr 2012 at 01:36PM.)   MCV 89   MCH 28.9   MCHC 32.6   RDW  16.1    Motrin: Other  ezetimibe: Unknown  rosuvastatin: Unknown  Simvastatin: Unknown  Vital Signs/Nurse's Notes: **Vital Signs.:   24-Sep-13 16:06   Vital Signs Type Admission   Temperature Temperature (F) 97.8   Celsius 36.5   Pulse Pulse 52   Respirations Respirations 20   Systolic BP Systolic BP 034   Diastolic BP (mmHg) Diastolic BP (mmHg) 59   Mean BP 89   Pulse Ox % Pulse Ox % 97   Oxygen Delivery 2L     Impression 1. Acute on chronic systolic heart failure. 2. History of coronary artery disease with no evidence of myocardial infarction. 3. Atrial fibrillation: Currently in sinus rhythm on amiodarone. 4. History of pulmonary hypertension and tricuspid regurgitation.    Plan the patient appears to be significantly fluid overloaded with pulmonary edema. I agree with IV furosemide. The etiology of her decompensation is not entirely clear. An echocardiogram was requested for evaluation. Continue treatment with metoprolol and lisinopril. continue amiodarone and anticoagulation for atrial fibrillation.   Electronic Signatures: Kathlyn Sacramento (MD)  (Signed 24-Sep-13 20:26)  Authored: General Aspect/Present Illness, History and Physical Exam, Review of System, Labs, Allergies, Vital  Signs/Nurse's Notes, Impression/Plan   Last Updated: 24-Sep-13 20:26 by Kathlyn Sacramento (MD)

## 2014-11-25 NOTE — H&P (Signed)
PATIENT NAME:  Deborah Conway, Deborah Conway MR#:  [id number] DATE OF BIRTH:  1922-02-27  DATE OF ADMISSION:  07/16/2013  PRIMARY CARE PHYSICIAN: Dr. Alphonsus Sias.   REFERRING PHYSICIAN:  Dr. Lenard Lance.    CHIEF COMPLAINT: Shortness of breath, cough, sputum, leg swelling for three weeks.   HISTORY OF PRESENT ILLNESS: An 81 year old Caucasian female with a history of CHF,  bronchitis, CAD, renal failure, presented to the ED with shortness of breath, cough, sputum,  leg swelling for three weeks. The patient is alert, awake, oriented, in no acute distress.  According to the patient, the patient has had shortness of breath, cough, sputum for the past three weeks. In addition, the patient has leg swelling and weight gain about 15 pounds. The patient also has orthopnea, nocturnal dyspnea, but the patient denies any fever or chills. No chest pain, palpitation, denies any other symptoms.   PAST MEDICAL HISTORY:  1.  Syncope due to dehydration and acute renal failure.  2.  Acute renal failure.  3.  Bronchitis.  4.  Chronic diastolic CHF.   5.  CAD status post stent.   6.  A. fib.   7.  Arthritis.  8.  GERD. 9.  Colon cancer.  10.  TIA.   PAST SURGICAL HISTORY: Carotid stent placement, tonsillectomy, appendectomy, colon resection.   SOCIAL HISTORY: The patient denies any smoking. drinking or illicit drugs. The patient lives alone.   FAMILY HISTORY: Father died of heart failure and mother died of heart failure.   ALLERGIES:  EZETIMIBE, MOTRIN, ROSUVASTATIN, SIMVASTATIN.  HOME MEDICATIONS:  1.  Tylenol 325 mg p.o. 2 tablets as needed q.4 hours p.r.n.  2.  Tramadol 50 mg p.o. q.6 hour's p.r.n.  3.  Senna Plus 50 mg/8.6 mg p.o. 2 tablets once at bedtime.  4.  ProAir HFA CFC free 90 mcg inhalation 1 puff four p.r.n. for shortness of breath, wheezing 5.  Omeprazole 20 mg p.o. daily.  6.  MiraLAX oral powder 17 grams p.o. daily.  7.  Metolazone 2.5 mg p.o. daily.  8.  Klor-Con M 20 mEq 1 tablet b.i.d.  9.  Fish  oil 1000 mg p.o. once a day.  10.   Colace 100 mg p.o. daily.  11.   Coumadin 5 mg p.o. daily.  12.   Amiodarone 10 mg p.o. 0.5 tablets b.i.d.  13.  Alprazolam 0.25 mg p.o. 2 to 3 times a day for anxiety.  14.  Acetaminophen/hydrocodone 1 tablet q.6 hour's p.r.n.   REVIEW OF SYSTEMS: CONSTITUTIONAL: The patient denies any fever or chills. No headache or dizziness, but has generalized weakness.  EYES: No double vision, blurred vision.  ENT: No postnasal drip, slurred speech or dysphagia.  CARDIOVASCULAR: No chest pain, palpitation, but has orthopnea,  nocturnal dyspnea and leg edema.  PULMONARY: Positive for cough, sputum, shortness of breath, but no hemoptysis.  GASTROINTESTINAL: No abdominal pain, nausea, vomiting or diarrhea. No melena or bloody stool.  GENITOURINARY: No dysuria, hematuria or incontinence.  SKIN: No rash or jaundice.  NEUROLOGY: No syncope, loss of consciousness or seizure.  HEMATOLOGIC: No easy bruising or bleeding.  ENDOCRINE: No polyuria, polydipsia, heat or cold intolerance.   PHYSICAL EXAMINATION: VITAL SIGNS: Temperature 97.4, blood pressure 141/77, pulse 59, oxygen saturation 94%.  GENERAL: The patient is alert, awake, oriented, in no acute distress.   HEENT: Pupils round, equal and reactive to light and accommodation.  Dry oral mucosa. Clear oropharynx. NECK: Supple. No JVD or carotid bruits are noted. No lymphadenopathy. No thyromegaly. CARDIOVASCULAR:  S1 and S2. Regular rate and rhythm. No murmurs or gallops. CARDIAC: S1 and S2. Regular rate and rhythm. No murmurs or gallops.  PULMONARY: Bilateral air entry. No wheezing, but has bilateral basilar rales. No use of accessory muscle to breathe.  ABDOMEN: Soft. No distention or tenderness. No organomegaly. Bowel sounds present.  EXTREMITIES: No edema, clubbing or cyanosis. No calf tenderness. Bilateral leg edema. No clubbing or cyanosis. No calf tenderness. Peripheral pulses present.   SKIN: No rash or  jaundice.  NEUROLOGY: Alert and oriented x 3. No focal deficit. Power 5/5. Sensation intact.   LABORATORY DATA: Troponin less than 0.02. Chest x-ray showed cardiomyopathy with underlying chronic interstitial coarsening in the right base atelectasis or infiltrate. BNP 3931. WBC 4.4, hemoglobin 9.8, platelets 96, glucose 121, BUN 33, creatinine 1.28, sodium 132, potassium 3.5, chloride 85, bicarb 44. Urinalysis is negative.   IMPRESSIONS: 1.  Acute on chronic congestive heart failure.  2.  Possible diastolic dysfunction.  3.  Dehydration.  4.  Hyponatremia.  5.  Weakness.  6.  Anemia.  7.  Thrombocytopenia.  8.  Hypertension.  9.  Coronary artery disease.  10.  Obesity.   PLAN OF TREATMENT: 1.  The patient will be admitted to telemetry floor, continue O2 by nasal cannula. Start congestive heart failure protocol, start Lasix 40 mg IV b.i.d. We will get an echocardiograph and a follow-up Dr. Mariah MillingGollan, cardiology consult.  2.  Follow up BMP, troponin level and lipid panel, TSH.  3.  Continue aspirin, Coumadin, amiodarone, lisinopril and continue metolazone and Lopressor.  4.  Gastrointestinal and deep vein thrombosis prophylaxis.  5. I discussed the patient's condition and plan of treatment with the patient and the patient's granddaughter.   TIME SPENT: About 60 minutes.    ____________________________ Shaune PollackQing Mamoru Takeshita, MD qc:NTS D: 07/16/2013 22:21:03 ET T: 07/16/2013 22:42:28 ET JOB#: [id number]  cc: Shaune PollackQing Charo Philipp, MD, <Dictator> Shaune PollackQING Tarica Harl MD ELECTRONICALLY SIGNED 07/19/2013 15:03

## 2014-11-25 NOTE — Consult Note (Signed)
General Aspect 81 year old woman with a history of coronary artery disease, PCI in the past with catheterization in May 2009 showing stable disease, paroxysmal atrial fibrillation, severe pulmonary hypertension by echocardiography done at [facility], chronic diastolic/systolic CHF , long smoking history, not on inhalers, chronic shortness of breath who is on chronic oxygen 4 L nasal cannula,  who was admitted from Dr. Daine Gip yeasterday due to worsening dyspnea and suspected heart failure.  She reported one month history of worsening shortness of breath, weight gain, leg edema in spite of taking po Lasix.   Echocardiogram from November 2011 shows mild aortic valve regurgitation, ejection fraction 35-40%.   Physical Exam:  GEN no acute distress   NECK supple  positive JVD   RESP no use of accessory muscles  crackles  crackles bilaterally   CARD Regular rate and rhythm  Murmur  3/6 holosystolic murmur at the left sternal border and apex   ABD denies tenderness   LYMPH negative axillae   EXTR negative cyanosis/clubbing, positive edema   SKIN normal to palpation   NEURO cranial nerves intact   PSYCH alert, A+O to time, place, person   Review of Systems:  Subjective/Chief Complaint dyspnea , orthopnea and edema.   General: Fatigue   Skin: No Complaints   ENT: No Complaints   Eyes: No Complaints   Neck: No Complaints   Respiratory: Short of breath   Cardiovascular: Dyspnea  Orthopnea  Edema   Gastrointestinal: No Complaints   Genitourinary: No Complaints   Vascular: No Complaints   Musculoskeletal: No Complaints   Neurologic: No Complaints   Hematologic: No Complaints   Endocrine: No Complaints   Psychiatric: No Complaints   Home Medications: Medication Instructions Status  aspirin 81 mg oral tablet 1 tab(s) orally once a day (9 am) Active  metolazone 2.5 mg oral tablet 1 tab(s) orally once a day (9 am) Active  MiraLax oral powder for  reconstitution 17 gram(s) in 8 ounces of water orally once a day (9 am) Active  Tylenol 325 mg oral tablet 2 tab(s) (650 mg) orally every 4 hours, As Needed- for Pain or temp >100.4 Active  amiodarone 100 mg oral tablet 0.5 tab ($Remove'50mg'VpnxOuz$ ) orally 2 times a day Active  Coumadin 5 mg oral tablet 1 tab(s) orally once a day Active  Fish Oil 1000 mg oral capsule 1 cap(s) orally once a day (12 pm) Active  Klor-Con M20 20 mEq oral tablet, extended release 1 tab(s) orally 2 times a day. *do not crush* Active  furosemide 40 mg oral tablet 1 tab(s) orally 2 times a day Active  omeprazole 20 mg oral delayed release capsule 1 cap(s) orally once a day before breakfast. *do not crush* Active  ProAir HFA CFC free 90 mcg/inh inhalation aerosol 1 puff(s) inhaled 4 times a day as needed for shortness of breath/ wheezing  Active  Senna Plus 50 mg-8.6 mg oral tablet 2 tab(s) orally once a day (at bedtime) Active  ALPRAZolam 0.25 mg oral tablet 1 tab(s) orally 2 to 3 times a day for anxiety Active  docusate sodium sodium 100 mg oral capsule 1 cap(s) orally once a day (in the morning) Active  acetaminophen-HYDROcodone 1 tab(s) orally every 6 hours, As Needed - for Pain Active  tramadol 50 mg oral tablet 1 tab(s) orally every 6 hours, As Needed- for Pain  Active   Lab Results: Thyroid:  13-Dec-14 00:25   Thyroid Stimulating Hormone  10.20 (0.45-4.50 (International Unit)  ----------------------- Pregnant patients have  different reference  ranges for TSH:  - - - - - - - - - -  Pregnant, first trimetser:  0.36 - 2.50 uIU/mL)  LabObservation:  13-Dec-14 10:57   OBSERVATION Reason for Test  Hepatic:  12-Dec-14 16:02   Bilirubin, Total 0.6  Alkaline Phosphatase 95 (45-117 NOTE: New Reference Range 06/25/13)  SGPT (ALT) 25  SGOT (AST) 30  Total Protein, Serum 8.2  Albumin, Serum 3.5  Routine Chem:  12-Dec-14 16:02   Glucose, Serum  121  BUN  33  Creatinine (comp) 1.28  Sodium, Serum  132  Potassium,  Serum 3.5  Chloride, Serum  85  CO2, Serum  44  Calcium (Total), Serum 9.6  Anion Gap  3  Osmolality (calc) 273  eGFR (African American)  43  eGFR (Non-African American)  37 (eGFR values <43mL/min/1.73 m2 may be an indication of chronic kidney disease (CKD). Calculated eGFR is useful in patients with stable renal function. The eGFR calculation will not be reliable in acutely ill patients when serum creatinine is changing rapidly. It is not useful in  patients on dialysis. The eGFR calculation may not be applicable to patients at the low and high extremes of body sizes, pregnant women, and vegetarians.)  Result Comment CARBON DIOXIDE - RESULTS VERIFIED BY REPEAT TESTING.  - NOTIFIED OF CRITICAL VALUE  - LAB/DEBBIE WILLOUGHBY AT 1653 07/16/13  - READ-BACK PROCESS PERFORMED.  Result(s) reported on 16 Jul 2013 at 04:39PM.  B-Type Natriuretic Peptide [facility])  [id number] (Result(s) reported on 16 Jul 2013 at 04:39PM.)  13-Dec-14 00:25   Glucose, Serum  124  BUN  34  Creatinine (comp) 1.29  Sodium, Serum  133  Potassium, Serum  3.0  Chloride, Serum  85  CO2, Serum  > 45  Calcium (Total), Serum 9.4  Anion Gap SEE COMMENT  Osmolality (calc) 275  eGFR (African American)  43  eGFR (Non-African American)  37 (eGFR values <42mL/min/1.73 m2 may be an indication of chronic kidney disease (CKD). Calculated eGFR is useful in patients with stable renal function. The eGFR calculation will not be reliable in acutely ill patients when serum creatinine is changing rapidly. It is not useful in  patients on dialysis. The eGFR calculation may not be applicable to patients at the low and high extremes of body sizes, pregnant women, and vegetarians.)  Result Comment CO2 - RESULTS VERIFIED BY REPEAT TESTING.  - CRITICAL VALUE PREVIOUSLY NOTIFIED.  Result(s) reported on 17 Jul 2013 at 01:13AM.  Magnesium, Serum 1.8 (1.8-2.4 THERAPEUTIC RANGE: 4-7 mg/dL TOXIC: > 10 mg/dL  -----------------------)   Hemoglobin A1c ([facility]) 5.6 (The American Diabetes Association recommends that a primary goal of therapy should be <7% and that physicians should reevaluate the treatment regimen in patients with HbA1c values consistently >8%.)  Cholesterol, Serum 164  Triglycerides, Serum 73  HDL (INHOUSE)  62  VLDL Cholesterol Calculated 15  LDL Cholesterol Calculated 87 (Result(s) reported on 17 Jul 2013 at 01:13AM.)  Cardiac:  12-Dec-14 16:02   Troponin I < 0.02 (0.00-0.05 0.05 ng/mL or less: NEGATIVE  Repeat testing in 3-6 hrs  if clinically indicated. >0.05 ng/mL: POTENTIAL  MYOCARDIAL INJURY. Repeat  testing in 3-6 hrs if  clinically indicated. NOTE: An increase or decrease  of 30% or more on serial  testing suggests a  clinically important change)  CK, Total 33  CPK-MB, Serum 0.7 (Result(s) reported on 16 Jul 2013 at 04:39PM.)    19:57   Troponin I < 0.02 (0.00-0.05 0.05 ng/mL or less: NEGATIVE  Repeat testing in 3-6 hrs  if clinically indicated. >0.05 ng/mL: POTENTIAL  MYOCARDIAL INJURY. Repeat  testing in 3-6 hrs if  clinically indicated. NOTE: An increase or decrease  of 30% or more on serial  testing suggests a  clinically important change)  13-Dec-14 00:25   Troponin I 0.02 (0.00-0.05 0.05 ng/mL or less: NEGATIVE  Repeat testing in 3-6 hrs  if clinically indicated. >0.05 ng/mL: POTENTIAL  MYOCARDIAL INJURY. Repeat  testing in 3-6 hrs if  clinically indicated. NOTE: An increase or decrease  of 30% or more on serial  testing suggests a  clinically important change)  Routine UA:  12-Dec-14 16:02   Color (UA) Yellow  Clarity (UA) Clear  Glucose (UA) Negative  Bilirubin (UA) Negative  Ketones (UA) Negative  Specific Gravity (UA) 1.016  Blood (UA) 1+  pH (UA) 5.0  Protein (UA) Negative  Nitrite (UA) Negative  Leukocyte Esterase (UA) Trace (Result(s) reported on 16 Jul 2013 at 04:53PM.)  RBC (UA) 4 /HPF  WBC (UA) 1 /HPF  Bacteria (UA) NONE SEEN  Epithelial Cells  (UA) 3 /HPF  Hyaline Cast (UA) 21 /LPF (Result(s) reported on 16 Jul 2013 at 04:53PM.)  Routine Coag:  12-Dec-14 16:02   Prothrombin  28.1  INR 2.7 (INR reference interval applies to patients on anticoagulant therapy. A single INR therapeutic range for coumarins is not optimal for all indications; however, the suggested range for most indications is 2.0 - 3.0. Exceptions to the INR Reference Range may include: Prosthetic heart valves, acute myocardial infarction, prevention of myocardial infarction, and combinations of aspirin and anticoagulant. The need for a higher or lower target INR must be assessed individually. Reference: The Pharmacology and Management of the Vitamin K  antagonists: the seventh ACCP Conference on Antithrombotic and Thrombolytic Therapy. [id number] Sept:126 (3suppl): N9146842. A HCT value >55% may artifactually increase the PT.  In one study,  the increase was an average of 25%. Reference:  "Effect on Routine and Special Coagulation Testing Values of Citrate Anticoagulant Adjustment in Patients with High HCT Values." American Journal of Clinical Pathology 2006;126:400-405.)  13-Dec-14 00:25   Prothrombin  28.4  INR 2.8 (INR reference interval applies to patients on anticoagulant therapy. A single INR therapeutic range for coumarins is not optimal for all indications; however, the suggested range for most indications is 2.0 - 3.0. Exceptions to the INR Reference Range may include: Prosthetic heart valves, acute myocardial infarction, prevention of myocardial infarction, and combinations of aspirin and anticoagulant. The need for a higher or lower target INR must be assessed individually. Reference: The Pharmacology and Management of the Vitamin K  antagonists: the seventh ACCP Conference on Antithrombotic and Thrombolytic Therapy. [id number] Sept:126 (3suppl): N9146842. A HCT value >55% may artifactually increase the PT.  In one study,  the increase was an  average of 25%. Reference:  "Effect on Routine and Special Coagulation Testing Values of Citrate Anticoagulant Adjustment in Patients with High HCT Values." American Journal of Clinical Pathology 2006;126:400-405.)  Routine Hem:  12-Dec-14 16:02   WBC (CBC) 4.4  RBC (CBC)  3.28  Hemoglobin (CBC)  9.8  Hematocrit (CBC)  30.9  Platelet Count (CBC)  96 (Result(s) reported on 16 Jul 2013 at 04:23PM.)  MCV 94  MCH 29.8  MCHC  31.6  RDW  16.8   Radiology Results: XRay:    12-Dec-14 16:22, Chest PA and Lateral  Chest PA and Lateral   REASON FOR EXAM:    Shortness of Breath  COMMENTS:   May transport  without cardiac monitor    PROCEDURE: DXR - DXR CHEST PA (OR AP) AND LATERAL  - Jul 16 2013  4:22PM     CLINICAL DATA:  Shortness of breath.    EXAM:  CHEST  2 VIEW    COMPARISON:  06/08/2012    FINDINGS:  The cardio pericardial silhouette is enlarged. Interstitial markings  are diffusely coarsened with chronic features. Patchy atelectasis or  infiltrate is seen at the right base, new in the interval. Scarring  is noted at both lung bases. No substantial pleural effusion. Bones  are diffusely demineralized mild compression deformity of the T11  vertebral body is stable since 05/02/2012.     IMPRESSION:  Cardiomegaly with underlying chronic interstitial coarsening and  right base atelectasis or infiltrate.    Osteopenia with stable compression deformity of the T11 vertebral  body.      Electronically Signed    By: Misty Stanley M.D.    On: 07/16/2013 16:26         Verified By: ERIC A. MANSELL, M.D.,    Motrin: Other  ezetimibe: Unknown  rosuvastatin: Unknown  Simvastatin: Unknown  Vital Signs/Nurse's Notes: **Vital Signs.:   13-Dec-14 12:08  Vital Signs Type Routine  Temperature Temperature (F) 97.5  Celsius 36.3  Temperature Source oral  Pulse Pulse 58  Respirations Respirations 16  Systolic BP Systolic BP 712  Diastolic BP (mmHg) Diastolic BP (mmHg) 87   Mean BP 99  Pulse Ox % Pulse Ox % 93  Pulse Ox Activity Level  At rest  Oxygen Delivery 3L    Impression 1. Acute on chronic systolic heart failure: improving with IV Lasix. Continue current dose.   2. History of coronary artery disease with no evidence of myocardial infarction: continu emedical therapy.   3. Atrial fibrillation: Currently in sinus rhythm on amiodarone. on anticoagulation with Warfarin.   4. History of pulmonary hypertension and tricuspid regurgitation.   Electronic Signatures: Kathlyn Sacramento (MD)  (Signed 13-Dec-14 12:19)  Authored: General Aspect/Present Illness, History and Physical Exam, Review of System, Home Medications, Labs, Radiology, Allergies, Vital Signs/Nurse's Notes, Impression/Plan   Last Updated: 13-Dec-14 12:19 by Kathlyn Sacramento (MD)

## 2014-12-04 NOTE — Discharge Summary (Signed)
PATIENT NAME:  Deborah Conway, BARDSLEY MR#:  [id number] DATE OF BIRTH:  1921-11-20  DATE OF ADMISSION:  07/16/2013 DATE OF DISCHARGE:  07/29/13  PRIMARY CARE PHYSICIAN:  Dr. Alphonsus Sias  FINAL DIAGNOSES: 1.  Acute respiratory failure.  2.  Acute diastolic congestive heart failure with severe mitral regurgitation.  3.  Acute renal failure continuing to worsen.  4.  Hyperkalemia.  5.  Hyponatremia.  6.  Atrial fibrillation.  7.  Gastroesophageal reflux disease with dysphagia.  8.  Hypothyroidism.  9.  Rheumatoid arthritis.  10.  Weakness.  11.  Anxiety.  The patient was admitted 07/16/2013, discharged to hospice home on July 29, 2013. The patient was admitted with shortness of breath, cough, sputum and leg swelling for the past 3 weeks. The patient was admitted with acute on chronic congestive heart failure and started on IV Lasix 40 mg IV b.i.d.   LABORATORY AND RADIOLOGICAL DATA DURING THE HOSPITAL COURSE: Included an INR of 2.7. Urinalysis: Trace leukocyte esterase. Troponin negative. Glucose 121, BUN 33, creatinine 1.28, sodium 132, potassium 3.5, chloride 85, CO2 of 44. Liver function tests normal range. White blood cell count 4.4, H and H 9.8 and 30.9, platelet count of 96. BNP 3931. Chest x-ray showed cardiomegaly, chronic interstitial coarsening and right base atelectasis. EKG showed a sinus bradycardia, occasional premature ventricular complexes. Next 2 troponins negative. TSH elevated at 10.2, LDL 87, HDL 62, triglycerides 73, hemoglobin A1c 5.6. Echocardiogram showed EF 50% to 55%, severely dilated left atrium, moderately dilated right atrium, restrictive pattern. Moderate to severe mitral regurgitation, mild aortic regurgitation, moderately elevated pulmonary arterial systolic pressure. Creatinine started creeping up during the hospital course, 1.34 on the 14th, 1.52 on the 15th, 1.72 on the 16th, 2.5 on the 17th. A repeat chest x-ray was on the 17th showed findings consistent with congestive heart  failure, bilateral pulmonary edema and small pleural effusion. Creatinine in the evening on the 17th 2.83, in the morning on the 18th 2.92 with a potassium of 5.7, sodium of 124. INR was 3.5.   CONSULTANTS DURING THE HOSPITAL COURSE: Included [facility], physical therapy, speech therapy, Dr. Harvie Junior from palliative care, Dr. Thedore Mins nephrology.  HOSPITAL COURSE PER PROBLEM LIST:  1.  For the patient's acute respiratory failure. The patient remained on oxygen the entire hospital stay. Roxanol did help the patient with breathing. 2.  For the patient's acute on chronic diastolic congestive heart failure with severe mitral regurgitation. The patient initially was started on IV Lasix and as creatinine went up, the patient was held off her Lasix, held on the lisinopril with the acute renal failure, held on the metoprolol with bradycardia and the patient was started on Lasix drip by nephrology. Creatinine started worsening even further. Plan because of difficult fluid balance and the family not wanting dialysis was to go to the hospice home.  3.  For the patient's acute renal failure. Creatinine worsened initially with diuresis, kept on worsening even with holding the Lasix and worsened more with Lasix drip. Family and patient did not want dialysis, made comfort care and transferred to the hospice home.  4.  For the patient's hyperkalemia. On the 18th, I stopped any supplementary, gave calcium chloride insulin D50 bicarbonate. 5.  For the patient's hyponatremia. This was secondary to congestive heart failure. 6.  For the patient's atrial fibrillation. They were therapeutic on Coumadin and then supratherapeutic including INR above 3 and Coumadin was on hold. The patient is on low-dose amiodarone. The patient alternated between atrial fibrillation and normal  sinus rhythm. Metoprolol was held secondary to bradycardia. 7.  For the gastroesophageal reflux disease and dysphagia. Seen in consultation by speech  therapy. Recommended PPI. 8.  For the hypothyroidism. The patient was initially started on levothyroxine. 9.  For the patient's rheumatoid arthritis. I did give small doses of prednisone during the hospital course. 10.  For her weakness. Physical therapy did see her. 11.  For anxiety and depression. She was started initially on Xanax and Celexa and Roxanol did help.  On 12/18, the patient was discharged to the hospice home with medications: Amiodarone 100 mg half tablet twice a day, acetaminophen suppository every 4 hours as needed for pain or temperature, nitroglycerin p.r.n. chest pain, Lasix 40 mg daily, glycopyrrolate 0.2 mg injectable every 4 hours as needed for secretions, lorazepam 0.5 mg 1 to 2 tablets orally sublingually every 2 to 4 hours as needed for anxiety, agitation, Roxanol 0.5 mL orally every 1 to 2 hours, Zofran ODT 4 mg every 6 hours as needed for nausea and vomiting.  TIME SPENT ON DISCHARGE:  35 minutes   ____________________________ Herschell Dimesichard J. Renae GlossWieting, MD rjw:ce D: 02/15/13 16:17:01 ET T: 02/15/13 16:46:56 ET JOB#: [id number]  cc: Herschell Dimesichard J. Renae GlossWieting, MD, <Dictator> Karie Schwalbeichard I. [place], MD Salley ScarletICHARD J Kei Mcelhiney MD ELECTRONICALLY SIGNED 07/23/2013 17:02
